# Patient Record
Sex: Female | Born: 1945 | Hispanic: No | Smoking: Never smoker
Health system: Southern US, Community
[De-identification: ages and names within clinical notes are randomized; demographics above are authoritative.]

## PROBLEM LIST (undated history)

## (undated) DIAGNOSIS — E119 Type 2 diabetes mellitus without complications: Secondary | ICD-10-CM

## (undated) DIAGNOSIS — M1990 Unspecified osteoarthritis, unspecified site: Secondary | ICD-10-CM

## (undated) DIAGNOSIS — E785 Hyperlipidemia, unspecified: Secondary | ICD-10-CM

## (undated) DIAGNOSIS — E079 Disorder of thyroid, unspecified: Secondary | ICD-10-CM

## (undated) DIAGNOSIS — M810 Age-related osteoporosis without current pathological fracture: Secondary | ICD-10-CM

## (undated) HISTORY — PX: KNEE ARTHROSCOPY: SUR90

## (undated) HISTORY — DX: Disorder of thyroid, unspecified: E07.9

## (undated) HISTORY — PX: ROTATOR CUFF REPAIR: SHX139

## (undated) HISTORY — PX: FINGER AMPUTATION: SHX636

## (undated) HISTORY — DX: Type 2 diabetes mellitus without complications: E11.9

## (undated) HISTORY — PX: SPINE SURGERY: SHX786

## (undated) HISTORY — DX: Unspecified osteoarthritis, unspecified site: M19.90

## (undated) HISTORY — DX: Hyperlipidemia, unspecified: E78.5

## (undated) HISTORY — DX: Age-related osteoporosis without current pathological fracture: M81.0

---

## 2019-08-31 ENCOUNTER — Ambulatory Visit (INDEPENDENT_AMBULATORY_CARE_PROVIDER_SITE_OTHER): Payer: Medicare Other | Admitting: Orthopaedic Surgery

## 2019-08-31 ENCOUNTER — Ambulatory Visit (INDEPENDENT_AMBULATORY_CARE_PROVIDER_SITE_OTHER): Payer: Medicare Other

## 2019-08-31 ENCOUNTER — Encounter: Payer: Self-pay | Admitting: Orthopaedic Surgery

## 2019-08-31 VITALS — Ht <= 58 in | Wt 125.0 lb

## 2019-08-31 DIAGNOSIS — M545 Low back pain, unspecified: Secondary | ICD-10-CM

## 2019-08-31 DIAGNOSIS — G8929 Other chronic pain: Secondary | ICD-10-CM

## 2019-08-31 MED ORDER — METHYLPREDNISOLONE 4 MG PO TBPK: ORAL_TABLET | ORAL | 0 refills | Status: DC

## 2019-08-31 MED ORDER — METHOCARBAMOL 500 MG PO TABS: 500.0000 mg | ORAL_TABLET | Freq: Two times a day (BID) | ORAL | 0 refills | Status: DC | PRN

## 2019-08-31 NOTE — Progress Notes (Signed)
   Office Visit Note   Patient: Christine Black           Date of Birth: Nov 09, 1946           MRN: [id number] Visit Date: 08/31/2019              Requested by: No referring provider defined for this encounter. PCP: Patient, No Pcp Per   Assessment & Plan: Visit Diagnoses:  1. Chronic low back pain, unspecified back pain laterality, unspecified whether sciatica present     Plan: Impression is multilevel lumbar spine spondylosis.  We will start the patient on an anti-inflammatory muscle relaxer as well as send her to physical therapy.  She will follow-up with [place] as needed.  Follow-Up Instructions: Return if symptoms worsen or fail to improve.   Orders:  Orders Placed This Encounter  Procedures  . XR Lumbar Spine 2-3 Views   Meds ordered this encounter  Medications  . methylPREDNISolone (MEDROL DOSEPAK) 4 MG TBPK tablet    Sig: Take as directed    Dispense:  21 tablet    Refill:  0  . methocarbamol (ROBAXIN) 500 MG tablet    Sig: Take 1 tablet (500 mg total) by mouth 2 (two) times daily as needed for muscle spasms.    Dispense:  20 tablet    Refill:  0      Procedures: No procedures performed   Clinical Data: No additional findings.   Subjective: Chief Complaint  Patient presents with  . Lower Back - Pain    HPI patient is a pleasant 73 year old female who presents our clinic today with left lower back pain as well as pain to the lateral lower leg.  This has been ongoing for the past 2 years.  No known injury or change in activity.  Pain she has is worse when she is sitting or sleeping as well as when she goes from a seated to standing position.  She does have a history of diabetic peripheral neuropathy and has been on gabapentin for this.  She is also tried Aleve for her symptoms with minimal improvement.  No previous lumbar ESI or surgical intervention.  Review of Systems as detailed in HPI.  All others reviewed and are negative.   Objective: Vital Signs: Ht 4'  7" (1.397 m)   Wt 125 lb (56.7 kg)   BMI 29.05 kg/m   Physical Exam well-developed and well-nourished female in no acute distress.  Alert and oriented x3.  Ortho Exam examination of the lumbar spine reveals no spinous tenderness.  She has mild tenderness to the paraspinous musculature.  Negative logroll negative straight leg raise.  No tenderness to the greater trochanter.  Specialty Comments:  No specialty comments available.  Imaging: Xr Lumbar Spine 2-3 Views  Result Date: 08/31/2019 Marked disc and facet space narrowing.    PMFS History: There are no active problems to display for this patient.  History reviewed. No pertinent past medical history.  History reviewed. No pertinent family history.  History reviewed. No pertinent surgical history. Social History   Occupational History  . Not on file  Tobacco Use  . Smoking status: Never Smoker  . Smokeless tobacco: Never Used  Substance and Sexual Activity  . Alcohol use: Not Currently  . Drug use: Never  . Sexual activity: Not on file

## 2019-09-04 ENCOUNTER — Telehealth: Payer: Self-pay | Admitting: Orthopaedic Surgery

## 2019-09-04 DIAGNOSIS — M545 Low back pain, unspecified: Secondary | ICD-10-CM

## 2019-09-04 DIAGNOSIS — G8929 Other chronic pain: Secondary | ICD-10-CM

## 2019-09-04 NOTE — Telephone Encounter (Signed)
Ok for change.

## 2019-09-04 NOTE — Telephone Encounter (Signed)
Message sent in error

## 2019-09-04 NOTE — Telephone Encounter (Signed)
ok 

## 2019-09-04 NOTE — Telephone Encounter (Signed)
Patient does not want to do PT at [facility]. She would like to be referred to [facility] for her PT.

## 2019-09-05 NOTE — Telephone Encounter (Signed)
Done, they are listed as [facility] (AKA [facility], [place] address)

## 2019-09-05 NOTE — Addendum Note (Signed)
Addended by: Meyer Cory on: 09/05/2019 09:34 AM   Modules accepted: Orders

## 2019-09-05 NOTE — Telephone Encounter (Signed)
Can you please get this referral sent to [facility] for PT for me?  I could not find them to choose them in the system.

## 2021-04-09 ENCOUNTER — Other Ambulatory Visit: Payer: Self-pay | Admitting: Physician Assistant

## 2021-04-09 DIAGNOSIS — Z1231 Encounter for screening mammogram for malignant neoplasm of breast: Secondary | ICD-10-CM

## 2021-04-15 DIAGNOSIS — M810 Age-related osteoporosis without current pathological fracture: Secondary | ICD-10-CM | POA: Diagnosis not present

## 2021-04-15 DIAGNOSIS — K219 Gastro-esophageal reflux disease without esophagitis: Secondary | ICD-10-CM | POA: Diagnosis not present

## 2021-04-15 DIAGNOSIS — K591 Functional diarrhea: Secondary | ICD-10-CM | POA: Diagnosis not present

## 2021-04-15 DIAGNOSIS — E1165 Type 2 diabetes mellitus with hyperglycemia: Secondary | ICD-10-CM | POA: Diagnosis not present

## 2021-04-15 DIAGNOSIS — J302 Other seasonal allergic rhinitis: Secondary | ICD-10-CM | POA: Diagnosis not present

## 2021-04-15 DIAGNOSIS — N289 Disorder of kidney and ureter, unspecified: Secondary | ICD-10-CM | POA: Diagnosis not present

## 2021-04-15 DIAGNOSIS — E782 Mixed hyperlipidemia: Secondary | ICD-10-CM | POA: Diagnosis not present

## 2021-04-15 DIAGNOSIS — E039 Hypothyroidism, unspecified: Secondary | ICD-10-CM | POA: Diagnosis not present

## 2021-05-05 DIAGNOSIS — R197 Diarrhea, unspecified: Secondary | ICD-10-CM | POA: Diagnosis not present

## 2021-05-13 DIAGNOSIS — J302 Other seasonal allergic rhinitis: Secondary | ICD-10-CM | POA: Diagnosis not present

## 2021-05-13 DIAGNOSIS — K219 Gastro-esophageal reflux disease without esophagitis: Secondary | ICD-10-CM | POA: Diagnosis not present

## 2021-05-13 DIAGNOSIS — K591 Functional diarrhea: Secondary | ICD-10-CM | POA: Diagnosis not present

## 2021-05-13 DIAGNOSIS — E782 Mixed hyperlipidemia: Secondary | ICD-10-CM | POA: Diagnosis not present

## 2021-05-13 DIAGNOSIS — E1165 Type 2 diabetes mellitus with hyperglycemia: Secondary | ICD-10-CM | POA: Diagnosis not present

## 2021-05-13 DIAGNOSIS — M810 Age-related osteoporosis without current pathological fracture: Secondary | ICD-10-CM | POA: Diagnosis not present

## 2021-05-13 DIAGNOSIS — E039 Hypothyroidism, unspecified: Secondary | ICD-10-CM | POA: Diagnosis not present

## 2021-05-13 DIAGNOSIS — N289 Disorder of kidney and ureter, unspecified: Secondary | ICD-10-CM | POA: Diagnosis not present

## 2021-06-02 ENCOUNTER — Ambulatory Visit
Admission: RE | Admit: 2021-06-02 | Discharge: 2021-06-02 | Disposition: A | Discharge status: Home / Self Care | Payer: Medicare Other | Source: Ambulatory Visit | Attending: Physician Assistant | Admitting: Physician Assistant

## 2021-06-02 ENCOUNTER — Encounter: Payer: Self-pay | Admitting: Cardiology

## 2021-06-02 ENCOUNTER — Ambulatory Visit: Payer: Medicare Other | Admitting: Cardiology

## 2021-06-02 ENCOUNTER — Other Ambulatory Visit: Payer: Self-pay

## 2021-06-02 VITALS — BP 124/82 | HR 91 | Temp 98.2°F | Resp 16 | Ht <= 58 in | Wt 118.8 lb

## 2021-06-02 DIAGNOSIS — Z1231 Encounter for screening mammogram for malignant neoplasm of breast: Secondary | ICD-10-CM

## 2021-06-02 DIAGNOSIS — E1165 Type 2 diabetes mellitus with hyperglycemia: Secondary | ICD-10-CM | POA: Diagnosis not present

## 2021-06-02 DIAGNOSIS — E782 Mixed hyperlipidemia: Secondary | ICD-10-CM

## 2021-06-02 MED ORDER — ASPIRIN 81 MG PO CHEW: 81.0000 mg | CHEWABLE_TABLET | Freq: Every day | ORAL | Status: DC

## 2021-06-02 NOTE — Progress Notes (Signed)
Primary Physician/Referring:  Benito Mccreedy, MD  Patient ID: Christine Black, female    DOB: 1946-03-11, 75 y.o.   MRN: [id number]  Chief Complaint  Patient presents with   Hyperlipidemia    familial   New Patient (Initial Visit)    Ref by Benito Mccreedy, MD   HPI:    Christine Black  is a 75 y.o. 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  Patient has diabetes mellitus, but no other cardiovascular risk factors, no hypertension or family history of premature coronary disease or tobacco use disorder.  She denies any chest pain or shortness of breath and states that she walks at least 1 hour to 1-1/2 hours on a daily basis without any limitations.  Past Medical History:  Diagnosis Date   Diabetes mellitus without complication ([place])    Hyperlipidemia    Thyroid disease    Past Surgical History:  Procedure Laterality Date   ROTATOR CUFF REPAIR Bilateral    Family History  Problem Relation Age of Onset   Heart attack Father    Breast cancer Neg Hx     Social History   Tobacco Use   Smoking status: Never   Smokeless tobacco: Never  Substance Use Topics   Alcohol use: Not Currently   Marital Status: Widowed  ROS  Review of Systems  Cardiovascular:  Negative for chest pain, dyspnea on exertion and leg swelling.  Gastrointestinal:  Negative for melena.  Objective  Blood pressure 124/82, pulse 91, temperature 98.2 F (36.8 C), temperature source Temporal, resp. rate 16, height _0  (1.397 m), weight 118 lb 12.8 oz (53.9 kg), SpO2 98 %. Body mass index is 27.61 kg/m.   Vitals with BMI 06/02/2021 08/31/2019  Height _1  _2   Weight 118 lbs 13 oz 125 lbs  BMI 76.16 07.37  Systolic 106 -  Diastolic 82 -  Pulse 91 -     Physical Exam Constitutional:      Comments: Short stature and overweight.  Neck:     Vascular: No carotid bruit or JVD.  Cardiovascular:     Rate and Rhythm: Normal rate and regular rhythm.      Pulses: Intact distal pulses.     Heart sounds: Normal heart sounds. No murmur heard.   No gallop.  Pulmonary:     Effort: Pulmonary effort is normal.     Breath sounds: Normal breath sounds.  Abdominal:     General: Bowel sounds are normal.     Palpations: Abdomen is soft.  Musculoskeletal:        General: No swelling.  Skin:    Capillary Refill: Capillary refill takes less than 2 seconds.  Neurological:     Mental Status: She is alert.  Psychiatric:        Behavior: Behavior is cooperative.     Laboratory examination:   Lipid Panel No results for input(s): CHOL, TRIG, LDLCALC, VLDL, HDL, CHOLHDL, LDLDIRECT in the last 8760 hours. Lipid Panel  No results found for: CHOL, TRIG, HDL, CHOLHDL, VLDL, LDLCALC, LDLDIRECT, LABVLDL   External labs:   Labs 04/08/2021:  Serum glucose 136, BUN 9, creatinine 0.89, EGFR 65/75 mill, CMP otherwise normal, potassium 4.0.  Total cholesterol 355, triglycerides 1124, HDL 48, LDL not calculated.  Non-HDL cholesterol 307.  A1c 7.3%.  TSH normal, T3 and free T4 normal.  Labs 12/19/2020:  Total cholesterol 278, triglycerides 130, HDL 52, non-HDL cholesterol 226.  Medications and allergies  No Known Allergies  Medication prior to this encounter:   Outpatient Medications Prior to Visit  Medication Sig Dispense Refill   esomeprazole (NEXIUM) 40 MG capsule Take 40 mg by mouth daily at 12 noon.     fenofibrate (TRICOR) 145 MG tablet Take 145 mg by mouth daily.     glipiZIDE (GLUCOTROL XL) 10 MG 24 hr tablet Take 10 mg by mouth daily.     ibandronate (BONIVA) 150 MG tablet Take 150 mg by mouth every 30 (thirty) days. Take in the morning with a full glass of water, on an empty stomach, and do not take anything else by mouth or lie down for the next 30 min.     levothyroxine (SYNTHROID) 75 MCG tablet TK 1 T PO QAM ON AN EMPTY STOMACH     meloxicam (MOBIC) 15 MG tablet Take 15 mg by mouth daily.     pioglitazone (ACTOS) 15 MG tablet TK 1 T PO  D     traMADol (ULTRAM) 50 MG tablet Take by mouth every 6 (six) hours as needed.     gabapentin (NEURONTIN) 300 MG capsule TK 1 C PO TID     metFORMIN (GLUCOPHAGE-XR) 500 MG 24 hr tablet TK 1 T PO BID     methocarbamol (ROBAXIN) 500 MG tablet Take 1 tablet (500 mg total) by mouth 2 (two) times daily as needed for muscle spasms. 20 tablet 0   methylPREDNISolone (MEDROL DOSEPAK) 4 MG TBPK tablet Take as directed 21 tablet 0   No facility-administered medications prior to visit.     FINAL MEDICATION AS OF TODAY:   Medications after current encounter Current Outpatient Medications  Medication Instructions   aspirin (ASPIRIN CHILDRENS) 81 mg, Oral, Daily   esomeprazole (NEXIUM) 40 mg, Oral, Daily   fenofibrate (TRICOR) 145 mg, Oral, Daily   glipiZIDE (GLUCOTROL XL) 10 mg, Oral, Daily   ibandronate (BONIVA) 150 mg, Oral, Every 30 days, Take in the morning with a full glass of water, on an empty stomach, and do not take anything else by mouth or lie down for the next 30 min.    levothyroxine (SYNTHROID) 75 MCG tablet TK 1 T PO QAM ON AN EMPTY STOMACH   meloxicam (MOBIC) 15 mg, Oral, Daily   pioglitazone (ACTOS) 15 MG tablet TK 1 T PO D   traMADol (ULTRAM) 50 MG tablet Oral, Every 6 hours PRN    Radiology:   No results found.  Cardiac Studies:   None EKG:   EKG 06/02/2021: Normal sinus rhythm at rate of 82 bpm, normal axis, incomplete right bundle branch block.  No evidence of ischemia.    Assessment     ICD-10-CM   1. Mixed hyperlipidemia  E78.2 EKG 12-Lead    LDL cholesterol, direct    Lipid Panel With LDL/HDL Ratio    Ambulatory referral to Nutrition and Diabetic Education    2. Type 2 diabetes mellitus with hyperglycemia, without long-term current use of insulin (HCC)  E11.65 aspirin (ASPIRIN CHILDRENS) 81 MG chewable tablet    Ambulatory referral to Nutrition and Diabetic Education       CV risk > 20%  Medications Discontinued During This Encounter  Medication Reason    gabapentin (NEURONTIN) 300 MG capsule Error   metFORMIN (GLUCOPHAGE-XR) 500 MG 24 hr tablet Error   methocarbamol (ROBAXIN) 500 MG tablet Error   methylPREDNISolone (MEDROL DOSEPAK) 4 MG TBPK tablet Error    Meds ordered this encounter  Medications   aspirin (ASPIRIN CHILDRENS) 81 MG chewable tablet  Sig: Chew 1 tablet (81 mg total) by mouth daily.   Orders Placed This Encounter  Procedures   LDL cholesterol, direct   Lipid Panel With LDL/HDL Ratio   Ambulatory referral to Nutrition and Diabetic Education    Referral Priority:   Routine    Referral Type:   Consultation    Referral Reason:   Specialty Services Required    Number of Visits Requested:   1   EKG 12-Lead   Recommendations:   Christine Black is a 75 y.o. 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  Patient has diabetes mellitus, but no other cardiovascular risk factors, no hypertension or family history of premature coronary disease or tobacco use disorder.  She has no clinical evidence of xanthelasma or tendon xanthoma or strong family history of premature coronary disease, recurrent pancreatitis or arthritis.  Hence familial origin to her elevated triglycerides is probably unlikely.  On further discussion she has very poor eating habits and especially has high carbohydrate intake.  I have recommended repeating fasting lipids again and obtaining direct LDL before starting her on therapy.  In view of diabetic state, I will prefer to be on a statin as well.  Labs were ordered today and I will make further recommendation depending upon the lab findings.  Referral made for diabetes education and diet education.  I have recommended that she start aspirin 81 mg daily.  She walks for about an hour every day without any chest pain or dyspnea, hence not sure whether she needs any cardiac work-up although her CV risk is >20% in view of her diabetes and her age as risk factors.  She  is presently 75 years of age and looks well with normal physical examination except for mild obesity.  Weight loss has been discussed extensively.  I will make further recommendations after the lipid profile testing and have a low threshold to start her on a statin and also start her on Vascepa.    Adrian Prows, MD, [facility] 06/02/2021, 9:54 PM Office: [phone]

## 2021-06-05 DIAGNOSIS — E782 Mixed hyperlipidemia: Secondary | ICD-10-CM | POA: Diagnosis not present

## 2021-06-06 LAB — LIPID PANEL WITH LDL/HDL RATIO
Cholesterol, Total: 261 mg/dL — ABNORMAL HIGH (ref 100–199)
HDL: 63 mg/dL (ref >39)
LDL Chol Calc (NIH): 164 mg/dL — ABNORMAL HIGH (ref 0–99)
LDL/HDL Ratio: 2.6 ratio (ref 0.0–3.2)
Triglycerides: 188 mg/dL — ABNORMAL HIGH (ref 0–149)
VLDL Cholesterol Cal: 34 mg/dL (ref 5–40)

## 2021-06-06 LAB — LDL CHOLESTEROL, DIRECT: LDL Direct: 161 mg/dL — ABNORMAL HIGH (ref 0–99)

## 2021-06-12 NOTE — Progress Notes (Signed)
Needs OV to discuss her lipids not urgent maybe in 4-6 weeks

## 2021-07-08 DIAGNOSIS — K219 Gastro-esophageal reflux disease without esophagitis: Secondary | ICD-10-CM | POA: Diagnosis not present

## 2021-07-08 DIAGNOSIS — E1165 Type 2 diabetes mellitus with hyperglycemia: Secondary | ICD-10-CM | POA: Diagnosis not present

## 2021-07-08 DIAGNOSIS — E039 Hypothyroidism, unspecified: Secondary | ICD-10-CM | POA: Diagnosis not present

## 2021-07-08 DIAGNOSIS — J302 Other seasonal allergic rhinitis: Secondary | ICD-10-CM | POA: Diagnosis not present

## 2021-07-08 DIAGNOSIS — E782 Mixed hyperlipidemia: Secondary | ICD-10-CM | POA: Diagnosis not present

## 2021-07-08 DIAGNOSIS — K591 Functional diarrhea: Secondary | ICD-10-CM | POA: Diagnosis not present

## 2021-07-08 DIAGNOSIS — M25561 Pain in right knee: Secondary | ICD-10-CM | POA: Diagnosis not present

## 2021-07-08 DIAGNOSIS — N289 Disorder of kidney and ureter, unspecified: Secondary | ICD-10-CM | POA: Diagnosis not present

## 2021-07-08 DIAGNOSIS — M810 Age-related osteoporosis without current pathological fracture: Secondary | ICD-10-CM | POA: Diagnosis not present

## 2021-07-08 DIAGNOSIS — I10 Essential (primary) hypertension: Secondary | ICD-10-CM | POA: Diagnosis not present

## 2021-07-11 ENCOUNTER — Other Ambulatory Visit: Payer: Self-pay

## 2021-07-11 ENCOUNTER — Ambulatory Visit: Payer: Medicare Other | Admitting: Cardiology

## 2021-07-11 ENCOUNTER — Encounter: Payer: Self-pay | Admitting: Cardiology

## 2021-07-11 VITALS — BP 134/76 | HR 75 | Temp 98.2°F | Resp 17 | Ht <= 58 in | Wt 121.4 lb

## 2021-07-11 DIAGNOSIS — E781 Pure hyperglyceridemia: Secondary | ICD-10-CM

## 2021-07-11 DIAGNOSIS — E782 Mixed hyperlipidemia: Secondary | ICD-10-CM

## 2021-07-11 MED ORDER — ATORVASTATIN CALCIUM 40 MG PO TABS
40.0000 mg | ORAL_TABLET | Freq: Every day | ORAL | 3 refills | Status: DC
Start: 2021-07-11 — End: 2021-09-05

## 2021-07-11 MED ORDER — ICOSAPENT ETHYL 1 G PO CAPS: 2.0000 g | ORAL_CAPSULE | Freq: Two times a day (BID) | ORAL | 3 refills | Status: DC

## 2021-07-11 NOTE — Progress Notes (Signed)
Primary Physician/Referring:  Benito Mccreedy, MD  Patient ID: Christine Black, female    DOB: [birth year], 75 y.o.   MRN: [id number]  Chief Complaint  Patient presents with   mixed hyperlipidemia    4-6 weeks   HPI:    Christine Black  is a 75 y.o. 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  Patient has diabetes mellitus, but no other cardiovascular risk factors, no hypertension or family history of premature coronary disease or tobacco use disorder.   Patient presents for follow up of hyperlipidemia and hypertriglyceridemia.  Recent lipid profile testing revealed LDL 161 and triglycerides 190.  Notably patient was recently started on amlodipine 2.5 mg daily by PCP for elevated blood pressure.  She is scheduled to see nutritionist next month as she continues to struggle to make diet modifications for cholesterol management and diabetes control.  Patient continues to walk approximately 1 hour on a daily basis without issue.  Denies chest pain, dyspnea, palpitations, syncope, near syncope.  Past Medical History:  Diagnosis Date   Diabetes mellitus without complication ([place])    Hyperlipidemia    Thyroid disease    Past Surgical History:  Procedure Laterality Date   ROTATOR CUFF REPAIR Bilateral    Family History  Problem Relation Age of Onset   Arthritis Mother    Heart attack Father 35   Arthritis Brother    Heart disease Brother    Lung disease Brother    Breast cancer Neg Hx     Social History   Tobacco Use   Smoking status: Never   Smokeless tobacco: Never  Substance Use Topics   Alcohol use: Not Currently   Marital Status: Widowed  ROS  Review of Systems  Constitutional: Negative for malaise/fatigue and weight gain.  Cardiovascular:  Negative for chest pain, claudication, dyspnea on exertion, leg swelling, near-syncope, orthopnea, palpitations, paroxysmal nocturnal dyspnea and syncope.  Respiratory:  Negative for  shortness of breath.   Gastrointestinal:  Negative for melena.  Neurological:  Negative for dizziness.  Objective  Blood pressure 134/76, pulse 75, temperature 98.2 F (36.8 C), temperature source Temporal, resp. rate 17, height '4\' 7"'  (1.397 m), weight 121 lb 6.4 oz (55.1 kg), SpO2 97 %. Body mass index is 28.22 kg/m.   Vitals with BMI 07/11/2021 06/02/2021 08/31/2019  Height '4\' 7"'  '4\' 7"'  '4\' 7"'   Weight 121 lbs 6 oz 118 lbs 13 oz 125 lbs  BMI 28.22 37.48 27.07  Systolic 867 544 -  Diastolic 76 82 -  Pulse 75 91 -     Physical Exam Vitals reviewed.  Constitutional:      Comments: Short stature and overweight.  Neck:     Vascular: No carotid bruit or JVD.  Cardiovascular:     Rate and Rhythm: Normal rate and regular rhythm.     Pulses: Intact distal pulses.     Heart sounds: Normal heart sounds. No murmur heard.   No gallop.  Pulmonary:     Effort: Pulmonary effort is normal.     Breath sounds: Normal breath sounds.  Musculoskeletal:        General: No swelling.  Skin:    Capillary Refill: Capillary refill takes less than 2 seconds.  Neurological:     Mental Status: She is alert.  Psychiatric:        Behavior: Behavior is cooperative.     Laboratory examination:   Lipid Panel Recent Labs    06/05/21 0810  CHOL 261*  TRIG 188*  LDLCALC 164*  HDL 63  LDLDIRECT 161*   Lipid Panel     Component Value Date/Time   CHOL 261 (H) 06/05/2021 0810   TRIG 188 (H) 06/05/2021 0810   HDL 63 06/05/2021 0810   LDLCALC 164 (H) 06/05/2021 0810   LDLDIRECT 161 (H) 06/05/2021 0810   LABVLDL 34 06/05/2021 0810     External labs:   04/08/2021: Serum glucose 136, BUN 9, creatinine 0.89, EGFR 65/75 mill, CMP otherwise normal, potassium 4.0. Total cholesterol 355, triglycerides 1124, HDL 48, LDL not calculated.  Non-HDL cholesterol 307. A1c 7.3%.  TSH normal, T3 and free T4 normal.  12/19/2020: Total cholesterol 278, triglycerides 130, HDL 52, non-HDL cholesterol  226.  Medications and allergies  No Known Allergies    Medication prior to this encounter:   Outpatient Medications Prior to Visit  Medication Sig Dispense Refill   aspirin (ASPIRIN CHILDRENS) 81 MG chewable tablet Chew 1 tablet (81 mg total) by mouth daily.     esomeprazole (NEXIUM) 40 MG capsule Take 40 mg by mouth daily at 12 noon.     fenofibrate (TRICOR) 145 MG tablet Take 145 mg by mouth daily.     glipiZIDE (GLUCOTROL XL) 10 MG 24 hr tablet Take 10 mg by mouth daily.     ibandronate (BONIVA) 150 MG tablet Take 150 mg by mouth every 30 (thirty) days. Take in the morning with a full glass of water, on an empty stomach, and do not take anything else by mouth or lie down for the next 30 min.     levothyroxine (SYNTHROID) 75 MCG tablet TK 1 T PO QAM ON AN EMPTY STOMACH     meloxicam (MOBIC) 15 MG tablet Take 15 mg by mouth daily.     pioglitazone (ACTOS) 15 MG tablet TK 1 T PO D     traMADol (ULTRAM) 50 MG tablet Take by mouth every 6 (six) hours as needed.     amLODipine (NORVASC) 2.5 MG tablet Take 1 tablet by mouth daily. (Patient not taking: Reported on 07/11/2021)     No facility-administered medications prior to visit.     FINAL MEDICATION AS OF TODAY:   Medications after current encounter Current Outpatient Medications  Medication Instructions   amLODipine (NORVASC) 2.5 MG tablet 1 tablet, Daily   aspirin (ASPIRIN CHILDRENS) 81 mg, Oral, Daily   atorvastatin (LIPITOR) 40 mg, Oral, Daily   esomeprazole (NEXIUM) 40 mg, Oral, Daily   fenofibrate (TRICOR) 145 mg, Oral, Daily   glipiZIDE (GLUCOTROL XL) 10 mg, Oral, Daily   ibandronate (BONIVA) 150 mg, Oral, Every 30 days, Take in the morning with a full glass of water, on an empty stomach, and do not take anything else by mouth or lie down for the next 30 min.    icosapent Ethyl (VASCEPA) 2 g, Oral, 2 times daily with meals   levothyroxine (SYNTHROID) 75 MCG tablet TK 1 T PO QAM ON AN EMPTY STOMACH   meloxicam (MOBIC) 15 mg,  Oral, Daily   pioglitazone (ACTOS) 15 MG tablet TK 1 T PO D   traMADol (ULTRAM) 50 MG tablet Oral, Every 6 hours PRN    Radiology:   No results found.  Cardiac Studies:   None  EKG:   EKG 06/02/2021: Normal sinus rhythm at rate of 82 bpm, normal axis, incomplete right bundle branch block.  No evidence of ischemia.    Assessment     ICD-10-CM   1. Mixed hyperlipidemia  E78.2 Lipid Panel With  LDL/HDL Ratio    Direct LDL    2. Hypertriglyceridemia  E78.1        CV risk > 20%  There are no discontinued medications.   Meds ordered this encounter  Medications   atorvastatin (LIPITOR) 40 MG tablet    Sig: Take 1 tablet (40 mg total) by mouth daily.    Dispense:  90 tablet    Refill:  3   icosapent Ethyl (VASCEPA) 1 g capsule    Sig: Take 2 capsules (2 g total) by mouth 2 (two) times daily with a meal.    Dispense:  360 capsule    Refill:  3   Orders Placed This Encounter  Procedures   Lipid Panel With LDL/HDL Ratio    Standing Status:   Future    Standing Expiration Date:   07/11/2022   Direct LDL    Standing Status:   Future    Standing Expiration Date:   07/11/2022   Recommendations:   Christine Black is a 75 y.o. 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  Patient has diabetes mellitus, but no other cardiovascular risk factors, no hypertension or family history of premature coronary disease or tobacco use disorder.  She has no clinical evidence of xanthelasma or tendon xanthoma or strong family history of premature coronary disease, recurrent pancreatitis or arthritis.  Hence familial origin to her elevated triglycerides is probably unlikely.    Patient presents for follow-up of hyperlipidemia and hypertriglyceridemia.  Patient's LDL and triglycerides remain elevated above goal.  We will start statin 40 mg nightly and Vascepa 2 g twice daily with meals.  We will repeat lipid profile testing including direct LDL in 8  weeks.  Patient continues to struggle with diet modifications, again discussed at length regarding the importance of heart healthy and low-carb diet both for cardiovascular risk reduction as well as diabetes control and weight loss.  Patient remains asymptomatic and very active on a daily basis.  However CV risk is >20% in view of diabetes, age, and risk factors. I have a low threshold to pursue further ischemic evaluation, however discussed with patient and her daughter who is present at bedside regarding benefits of further ischemic evaluation and shared decision was to hold off at this time.  Follow-up in 8 weeks, sooner if needed, for hyperlipidemia and hypertriglyceridemia.   Christine Berthold, PA-C 07/11/2021, 1:57 PM Office: [phone]

## 2021-07-11 NOTE — Patient Instructions (Signed)
Diabetes Mellitus and Nutrition, Adult When you have diabetes, or diabetes mellitus, it is very important to have healthy eating habits because your blood sugar (glucose) levels are greatly affected by what you eat and drink. Eating healthy foods in the right amounts, at about the same times every day, can help you:  Control your blood glucose.  Lower your risk of heart disease.  Improve your blood pressure.  Reach or maintain a healthy weight. What can affect my meal plan? Every person with diabetes is different, and each person has different needs for a meal plan. Your health care provider may recommend that you work with a dietitian to make a meal plan that is best for you. Your meal plan may vary depending on factors such as:  The calories you need.  The medicines you take.  Your weight.  Your blood glucose, blood pressure, and cholesterol levels.  Your activity level.  Other health conditions you have, such as heart or kidney disease. How do carbohydrates affect me? Carbohydrates, also called carbs, affect your blood glucose level more than any other type of food. Eating carbs naturally raises the amount of glucose in your blood. Carb counting is a method for keeping track of how many carbs you eat. Counting carbs is important to keep your blood glucose at a healthy level, especially if you use insulin or take certain oral diabetes medicines. It is important to know how many carbs you can safely have in each meal. This is different for every person. Your dietitian can help you calculate how many carbs you should have at each meal and for each snack. How does alcohol affect me? Alcohol can cause a sudden decrease in blood glucose (hypoglycemia), especially if you use insulin or take certain oral diabetes medicines. Hypoglycemia can be a life-threatening condition. Symptoms of hypoglycemia, such as sleepiness, dizziness, and confusion, are similar to symptoms of having too much  alcohol.  Do not drink alcohol if: ? Your health care provider tells you not to drink. ? You are pregnant, may be pregnant, or are planning to become pregnant.  If you drink alcohol: ? Do not drink on an empty stomach. ? Limit how much you use to:  0-1 drink a day for women.  0-2 drinks a day for men. ? Be aware of how much alcohol is in your drink. In the [place] one drink equals one 12 oz bottle of beer (355 mL), one 5 oz glass of wine (148 mL), or one 1 oz glass of hard liquor (44 mL). ? Keep yourself hydrated with water, diet soda, or unsweetened iced tea.  Keep in mind that regular soda, juice, and other mixers may contain a lot of sugar and must be counted as carbs. What are tips for following this plan? Reading food labels  Start by checking the serving size on the "Nutrition Facts" label of packaged foods and drinks. The amount of calories, carbs, fats, and other nutrients listed on the label is based on one serving of the item. Many items contain more than one serving per package.  Check the total grams (g) of carbs in one serving. You can calculate the number of servings of carbs in one serving by dividing the total carbs by 15. For example, if a food has 30 g of total carbs per serving, it would be equal to 2 servings of carbs.  Check the number of grams (g) of saturated fats and trans fats in one serving. Choose foods that have   a low amount or none of these fats.  Check the number of milligrams (mg) of salt (sodium) in one serving. Most people should limit total sodium intake to less than 2,300 mg per day.  Always check the nutrition information of foods labeled as "low-fat" or "nonfat." These foods may be higher in added sugar or refined carbs and should be avoided.  Talk to your dietitian to identify your daily goals for nutrients listed on the label. Shopping  Avoid buying canned, pre-made, or processed foods. These foods tend to be high in fat, sodium, and added  sugar.  Shop around the outside edge of the grocery store. This is where you will most often find fresh fruits and vegetables, bulk grains, fresh meats, and fresh dairy. Cooking  Use low-heat cooking methods, such as baking, instead of high-heat cooking methods like deep frying.  Cook using healthy oils, such as olive, canola, or sunflower oil.  Avoid cooking with butter, cream, or high-fat meats. Meal planning  Eat meals and snacks regularly, preferably at the same times every day. Avoid going long periods of time without eating.  Eat foods that are high in fiber, such as fresh fruits, vegetables, beans, and whole grains. Talk with your dietitian about how many servings of carbs you can eat at each meal.  Eat 4-6 oz (112-168 g) of lean protein each day, such as lean meat, chicken, fish, eggs, or tofu. One ounce (oz) of lean protein is equal to: ? 1 oz (28 g) of meat, chicken, or fish. ? 1 egg. ?  cup (62 g) of tofu.  Eat some foods each day that contain healthy fats, such as avocado, nuts, seeds, and fish.   What foods should I eat? Fruits Berries. Apples. Oranges. Peaches. Apricots. Plums. Grapes. Mango. Papaya. Pomegranate. Kiwi. Cherries. Vegetables Lettuce. Spinach. Leafy greens, including kale, chard, collard greens, and mustard greens. Beets. Cauliflower. Cabbage. Broccoli. Carrots. Green beans. Tomatoes. Peppers. Onions. Cucumbers. Brussels sprouts. Grains Whole grains, such as whole-wheat or whole-grain bread, crackers, tortillas, cereal, and pasta. Unsweetened oatmeal. Quinoa. Brown or wild rice. Meats and other proteins Seafood. Poultry without skin. Lean cuts of poultry and beef. Tofu. Nuts. Seeds. Dairy Low-fat or fat-free dairy products such as milk, yogurt, and cheese. The items listed above may not be a complete list of foods and beverages you can eat. Contact a dietitian for more information. What foods should I avoid? Fruits Fruits canned with  syrup. Vegetables Canned vegetables. Frozen vegetables with butter or cream sauce. Grains Refined white flour and flour products such as bread, pasta, snack foods, and cereals. Avoid all processed foods. Meats and other proteins Fatty cuts of meat. Poultry with skin. Breaded or fried meats. Processed meat. Avoid saturated fats. Dairy Full-fat yogurt, cheese, or milk. Beverages Sweetened drinks, such as soda or iced tea. The items listed above may not be a complete list of foods and beverages you should avoid. Contact a dietitian for more information. Questions to ask a health care provider  Do I need to meet with a diabetes educator?  Do I need to meet with a dietitian?  What number can I call if I have questions?  When are the best times to check my blood glucose? Where to find more information:  American Diabetes Association: diabetes.org  Academy of Nutrition and Dietetics: www.eatright.org  National Institute of Diabetes and Digestive and Kidney Diseases: www.niddk.nih.gov  Association of Diabetes Care and Education Specialists: www.diabeteseducator.org Summary  It is important to have healthy eating   habits because your blood sugar (glucose) levels are greatly affected by what you eat and drink.  A healthy meal plan will help you control your blood glucose and maintain a healthy lifestyle.  Your health care provider may recommend that you work with a dietitian to make a meal plan that is best for you.  Keep in mind that carbohydrates (carbs) and alcohol have immediate effects on your blood glucose levels. It is important to count carbs and to use alcohol carefully. This information is not intended to replace advice given to you by your health care provider. Make sure you discuss any questions you have with your health care provider. Document Revised: 11/07/2019 Document Reviewed: 11/07/2019 Elsevier Patient Education  2021 [employer] Inc.  

## 2021-07-21 DIAGNOSIS — E782 Mixed hyperlipidemia: Secondary | ICD-10-CM | POA: Diagnosis not present

## 2021-07-21 DIAGNOSIS — I10 Essential (primary) hypertension: Secondary | ICD-10-CM | POA: Diagnosis not present

## 2021-07-21 DIAGNOSIS — E039 Hypothyroidism, unspecified: Secondary | ICD-10-CM | POA: Diagnosis not present

## 2021-07-21 DIAGNOSIS — M810 Age-related osteoporosis without current pathological fracture: Secondary | ICD-10-CM | POA: Diagnosis not present

## 2021-07-21 DIAGNOSIS — J028 Acute pharyngitis due to other specified organisms: Secondary | ICD-10-CM | POA: Diagnosis not present

## 2021-07-21 DIAGNOSIS — E1165 Type 2 diabetes mellitus with hyperglycemia: Secondary | ICD-10-CM | POA: Diagnosis not present

## 2021-07-21 DIAGNOSIS — N289 Disorder of kidney and ureter, unspecified: Secondary | ICD-10-CM | POA: Diagnosis not present

## 2021-07-21 DIAGNOSIS — M25561 Pain in right knee: Secondary | ICD-10-CM | POA: Diagnosis not present

## 2021-07-21 DIAGNOSIS — J302 Other seasonal allergic rhinitis: Secondary | ICD-10-CM | POA: Diagnosis not present

## 2021-07-21 DIAGNOSIS — K219 Gastro-esophageal reflux disease without esophagitis: Secondary | ICD-10-CM | POA: Diagnosis not present

## 2021-07-21 DIAGNOSIS — K591 Functional diarrhea: Secondary | ICD-10-CM | POA: Diagnosis not present

## 2021-08-01 ENCOUNTER — Ambulatory Visit: Payer: Medicare Other | Admitting: Dietician

## 2021-08-02 ENCOUNTER — Encounter (HOSPITAL_COMMUNITY): Payer: Self-pay

## 2021-08-02 ENCOUNTER — Ambulatory Visit (HOSPITAL_COMMUNITY)
Admission: EM | Admit: 2021-08-02 | Discharge: 2021-08-02 | Disposition: A | Discharge status: Home / Self Care | Payer: Medicare Other | Attending: Medical Oncology | Admitting: Medical Oncology

## 2021-08-02 ENCOUNTER — Other Ambulatory Visit: Payer: Self-pay

## 2021-08-02 DIAGNOSIS — U071 COVID-19: Secondary | ICD-10-CM | POA: Insufficient documentation

## 2021-08-02 DIAGNOSIS — J069 Acute upper respiratory infection, unspecified: Secondary | ICD-10-CM

## 2021-08-02 MED ORDER — BENZONATATE 100 MG PO CAPS: 100.0000 mg | ORAL_CAPSULE | Freq: Three times a day (TID) | ORAL | 0 refills | Status: DC

## 2021-08-02 NOTE — ED Triage Notes (Signed)
Pt presents with non productive cough and generalized body aches since yesterday with some lower abdominal pain.

## 2021-08-02 NOTE — ED Provider Notes (Addendum)
[facility]    CSN: [id number] Arrival date & time: 08/02/21  1500      History   Chief Complaint Chief Complaint  Patient presents with   Cough   Generalized Body Aches    HPI Lakrista Scaduto is a 75 y.o. female.  Patient defers medical translator and is able to answer all of my questions appropriately and relay back her instructions.  HPI  Cold Symptoms: Patient reports that since yesterday they have had a dry cough along with body aches and fatigue.  They deny any fever, significant sore throat, chest pain or shortness of breath.  They have had some abdominal discomfort as well without vomiting, nausea, dysuria, frequency or diarrhea.  They have tried aleve for symptoms with little relief.  No known sick contacts.  Past Medical History:  Diagnosis Date   Diabetes mellitus without complication (HCC)    Hyperlipidemia    Thyroid disease     There are no problems to display for this patient.   Past Surgical History:  Procedure Laterality Date   ROTATOR CUFF REPAIR Bilateral     OB History   No obstetric history on file.      Home Medications    Prior to Admission medications   Medication Sig Start Date End Date Taking? Authorizing Provider  amLODipine (NORVASC) 2.5 MG tablet Take 1 tablet by mouth daily. Patient not taking: Reported on 07/11/2021 07/08/21   [provider]  aspirin (ASPIRIN CHILDRENS) 81 MG chewable tablet Chew 1 tablet (81 mg total) by mouth daily. 06/02/21   Yates Decamp, MD  atorvastatin (LIPITOR) 40 MG tablet Take 1 tablet (40 mg total) by mouth daily. 07/11/21 07/06/22  Cantwell, Celeste C, PA-C  esomeprazole (NEXIUM) 40 MG capsule Take 40 mg by mouth daily at 12 noon.    [provider]  fenofibrate (TRICOR) 145 MG tablet Take 145 mg by mouth daily.    [provider]  glipiZIDE (GLUCOTROL XL) 10 MG 24 hr tablet Take 10 mg by mouth daily. 05/13/21   [provider]  ibandronate (BONIVA) 150 MG tablet  Take 150 mg by mouth every 30 (thirty) days. Take in the morning with a full glass of water, on an empty stomach, and do not take anything else by mouth or lie down for the next 30 min.    [provider]  icosapent Ethyl (VASCEPA) 1 g capsule Take 2 capsules (2 g total) by mouth 2 (two) times daily with a meal. 07/11/21   Cantwell, Celeste C, PA-C  levothyroxine (SYNTHROID) 75 MCG tablet TK 1 T PO QAM ON AN EMPTY STOMACH 08/01/19   [provider]  meloxicam (MOBIC) 15 MG tablet Take 15 mg by mouth daily.    [provider]  pioglitazone (ACTOS) 15 MG tablet TK 1 T PO D 06/20/19   [provider]  traMADol (ULTRAM) 50 MG tablet Take by mouth every 6 (six) hours as needed.    [provider]    Family History Family History  Problem Relation Age of Onset   Arthritis Mother    Heart attack Father 32   Arthritis Brother    Heart disease Brother    Lung disease Brother    Breast cancer Neg Hx     Social History Social History   Tobacco Use   Smoking status: Never   Smokeless tobacco: Never  Vaping Use   Vaping Use: Never used  Substance Use Topics   Alcohol use: Not  Currently   Drug use: Never     Allergies   Patient has no known allergies.   Review of Systems Review of Systems  As stated above in HPI Physical Exam Triage Vital Signs ED Triage Vitals  Enc Vitals Group     BP 08/02/21 1517 (!) 148/68     Pulse Rate 08/02/21 1517 73     Resp 08/02/21 1517 17     Temp 08/02/21 1517 98.7 F (37.1 C)     Temp Source 08/02/21 1517 Oral     SpO2 08/02/21 1517 100 %     Weight --      Height --      Head Circumference --      Peak Flow --      Pain Score 08/02/21 1519 7     Pain Loc --      Pain Edu? --      Excl. in GC? --    No data found.  Updated Vital Signs BP (!) 148/68 (BP Location: Right Arm)   Pulse 73   Temp 98.7 F (37.1 C) (Oral)   Resp 17   SpO2 100%   Physical Exam Vitals and nursing note reviewed.   Constitutional:      General: She is not in acute distress.    Appearance: Normal appearance. She is not ill-appearing, toxic-appearing or diaphoretic.  HENT:     Head: Normocephalic and atraumatic.     Right Ear: Tympanic membrane normal.     Left Ear: Tympanic membrane normal.     Nose: Nose normal.     Mouth/Throat:     Mouth: Mucous membranes are moist.     Pharynx: Oropharynx is clear. No oropharyngeal exudate or posterior oropharyngeal erythema.  Eyes:     Extraocular Movements: Extraocular movements intact.     Pupils: Pupils are equal, round, and reactive to light.  Cardiovascular:     Rate and Rhythm: Normal rate and regular rhythm.     Heart sounds: Normal heart sounds.  Pulmonary:     Effort: Pulmonary effort is normal.     Breath sounds: Normal breath sounds.     Comments: Dry slightly wheeze like cough Musculoskeletal:     Cervical back: Normal range of motion and neck supple.  Lymphadenopathy:     Cervical: No cervical adenopathy.  Skin:    General: Skin is warm.  Neurological:     Mental Status: She is alert and oriented to person, place, and time.     UC Treatments / Results  Labs (all labs ordered are listed, but only abnormal results are displayed) Labs Reviewed - No data to display  EKG   Radiology No results found.  Procedures Procedures (including critical care time)  Medications Ordered in UC Medications - No data to display  Initial Impression / Assessment and Plan / UC Course  I have reviewed the triage vital signs and the nursing notes.  Pertinent labs & imaging results that were available during my care of the patient were reviewed by me and considered in my medical decision making (see chart for details).     New.  Likely viral in nature which I discussed with patient.  With her oxygen saturation being 100% and she is afebrile.  Hold off on x-ray imaging at this time.  She is agreeable to COVID-19 testing.  Sending in [place] for  her to use as needed.  Discussed red flag signs and symptoms. Final Clinical Impressions(s) / UC Diagnoses  Final diagnoses:  None   Discharge Instructions   None    ED Prescriptions   None    PDMP not reviewed this encounter.   Rushie Chestnut, PA-C 08/02/21 1618    Rushie Chestnut, PA-C 08/02/21 [id number]

## 2021-08-03 LAB — SARS CORONAVIRUS 2 (TAT 6-24 HRS): SARS Coronavirus 2: POSITIVE — AB

## 2021-08-04 ENCOUNTER — Telehealth: Payer: Self-pay | Admitting: Pharmacist

## 2021-08-04 NOTE — Telephone Encounter (Signed)
Pt's Vascepa PA appeal approved. Called and discussed with pt and and pt's daughter. Co-pay of $30/37-month supply. Pt agreeable to pick up the medication and start taking it regularly moving forward.

## 2021-08-05 ENCOUNTER — Telehealth: Payer: Self-pay | Admitting: Pharmacist

## 2021-08-05 NOTE — Telephone Encounter (Signed)
Med list reviewed and updated with pt and pt's daughter. Pt noted to be taking both fenofibrate and atorvastatin. Pt noted to have some complains of muscle aches and pain. Pt recently diagnosed with COVID and is unsure if symptom is related to COVID or concerns of myopathy. Pt has an OV with PCP this Thursday and is planing to discuss with PCP. Reviewed with pt the need to get repeat labs in ~4 weeks to evaluate response from new start atorvastatin and vascepa.

## 2021-08-20 DIAGNOSIS — E1165 Type 2 diabetes mellitus with hyperglycemia: Secondary | ICD-10-CM | POA: Diagnosis not present

## 2021-08-20 DIAGNOSIS — M25561 Pain in right knee: Secondary | ICD-10-CM | POA: Diagnosis not present

## 2021-08-20 DIAGNOSIS — K591 Functional diarrhea: Secondary | ICD-10-CM | POA: Diagnosis not present

## 2021-08-20 DIAGNOSIS — J302 Other seasonal allergic rhinitis: Secondary | ICD-10-CM | POA: Diagnosis not present

## 2021-08-20 DIAGNOSIS — Z0000 Encounter for general adult medical examination without abnormal findings: Secondary | ICD-10-CM | POA: Diagnosis not present

## 2021-08-20 DIAGNOSIS — E039 Hypothyroidism, unspecified: Secondary | ICD-10-CM | POA: Diagnosis not present

## 2021-08-20 DIAGNOSIS — N289 Disorder of kidney and ureter, unspecified: Secondary | ICD-10-CM | POA: Diagnosis not present

## 2021-08-20 DIAGNOSIS — K219 Gastro-esophageal reflux disease without esophagitis: Secondary | ICD-10-CM | POA: Diagnosis not present

## 2021-08-20 DIAGNOSIS — I10 Essential (primary) hypertension: Secondary | ICD-10-CM | POA: Diagnosis not present

## 2021-08-20 DIAGNOSIS — E782 Mixed hyperlipidemia: Secondary | ICD-10-CM | POA: Diagnosis not present

## 2021-08-20 DIAGNOSIS — M810 Age-related osteoporosis without current pathological fracture: Secondary | ICD-10-CM | POA: Diagnosis not present

## 2021-09-03 NOTE — Progress Notes (Signed)
Primary Physician/Referring:  Benito Mccreedy, MD  Patient ID: Christine Black, female    DOB: [birth year], 75 y.o.   MRN: [id number]  Chief Complaint  Patient presents with   Hyperlipidemia   Follow-up   Results   muscle aches   HPI:    Christine Black  is a 75 y.o. 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  Patient has diabetes mellitus, but no other cardiovascular risk factors, no hypertension or family history of premature coronary disease or tobacco use disorder.   Patient presents for 2 month follow-up of hyperlipidemia and hypertriglyceridemia.  At last office visit started atorvastatin 40 mg daily as well as Vascepa 2 g twice daily.  However patient has not been taking Vascepa.  Repeat lipid profile testing revealed significant improvement, triglycerides reduced from 1124 to 105 and LDL now 75.  Made significant changes to her diet, reducing carbs and overall calorie intake.  Patient's primary concern today is complaints of myalgias since initiation of atorvastatin.   Patient continues to walk approximately 1 hour on a daily basis without issue.  Denies chest pain, dyspnea, palpitations, syncope, near syncope.  Notably patient has also inadvertently discontinued amlodipine.  Patient is accompanied by her daughter at today's office visit.   Past Medical History:  Diagnosis Date   Diabetes mellitus without complication ([place])    Hyperlipidemia    Thyroid disease    Past Surgical History:  Procedure Laterality Date   ROTATOR CUFF REPAIR Bilateral    Family History  Problem Relation Age of Onset   Arthritis Mother    Heart attack Father 64   Arthritis Brother    Heart disease Brother    Lung disease Brother    Breast cancer Neg Hx     Social History   Tobacco Use   Smoking status: Never   Smokeless tobacco: Never  Substance Use Topics   Alcohol use: Not Currently   Marital Status: Widowed  ROS  Review of Systems   Constitutional: Negative for malaise/fatigue and weight gain.  Cardiovascular:  Negative for chest pain, claudication, dyspnea on exertion, leg swelling, near-syncope, orthopnea, palpitations, paroxysmal nocturnal dyspnea and syncope.  Respiratory:  Negative for shortness of breath.   Musculoskeletal:  Positive for myalgias.  Gastrointestinal:  Negative for melena.  Neurological:  Negative for dizziness.  Objective  Blood pressure 132/71, pulse 76, temperature 98 F (36.7 C), temperature source Temporal, height '4\' 7"'  (1.397 m), weight 121 lb (54.9 kg), SpO2 99 %. Body mass index is 28.12 kg/m.   Vitals with BMI 09/05/2021 08/02/2021 07/11/2021  Height '4\' 7"'  - '4\' 7"'   Weight 121 lbs - 121 lbs 6 oz  BMI 95.09 - 32.67  Systolic 124 580 998  Diastolic 71 68 76  Pulse 76 73 75     Physical Exam Vitals reviewed.  Constitutional:      Comments: Short stature and overweight.  Neck:     Vascular: No carotid bruit or JVD.  Cardiovascular:     Rate and Rhythm: Normal rate and regular rhythm.     Pulses: Intact distal pulses.     Heart sounds: Normal heart sounds, S1 normal and S2 normal. No murmur heard.   No gallop.  Pulmonary:     Effort: Pulmonary effort is normal. No respiratory distress.     Breath sounds: Normal breath sounds. No wheezing, rhonchi or rales.  Musculoskeletal:     Right lower leg: No edema.     Left lower leg: No  edema.  Neurological:     Mental Status: She is alert.  Psychiatric:        Behavior: Behavior is cooperative.     Laboratory examination:   Lipid Panel Recent Labs    06/05/21 0810  CHOL 261*  TRIG 188*  LDLCALC 164*  HDL 63  LDLDIRECT 161*   Lipid Panel     Component Value Date/Time   CHOL 261 (H) 06/05/2021 0810   TRIG 188 (H) 06/05/2021 0810   HDL 63 06/05/2021 0810   LDLCALC 164 (H) 06/05/2021 0810   LDLDIRECT 161 (H) 06/05/2021 0810   LABVLDL 34 06/05/2021 0810     External labs:  08/20/2021: Glucose 116, BUN 21, creatinine 1.26,  GFR 45, sodium 143, potassium 4.7 Hgb 12.0, HCT 37.4, MCV 100, platelet 396 Triglycerides 105, total cholesterol 152, HDL 58, LDL 75 A1c 6.9% TSH 1.65  04/08/2021: Serum glucose 136, BUN 9, creatinine 0.89, EGFR 65/75 mill, CMP otherwise normal, potassium 4.0. Total cholesterol 355, triglycerides 1124, HDL 48, LDL not calculated.  Non-HDL cholesterol 307. A1c 7.3%.  TSH normal, T3 and free T4 normal.  12/19/2020: Total cholesterol 278, triglycerides 130, HDL 52, non-HDL cholesterol 226.  Allergies  No Known Allergies   Medications Prior to Visit:   Outpatient Medications Prior to Visit  Medication Sig Dispense Refill   Apple Cider Vinegar 188 MG CAPS Take by mouth.     aspirin (ASPIRIN CHILDRENS) 81 MG chewable tablet Chew 1 tablet (81 mg total) by mouth daily.     fenofibrate (TRICOR) 145 MG tablet Take 145 mg by mouth daily.     gabapentin (NEURONTIN) 300 MG capsule Take 300 mg by mouth 2 (two) times daily.     glipiZIDE (GLUCOTROL XL) 10 MG 24 hr tablet Take 10 mg by mouth daily.     ibandronate (BONIVA) 150 MG tablet Take 150 mg by mouth every 30 (thirty) days. Take in the morning with a full glass of water, on an empty stomach, and do not take anything else by mouth or lie down for the next 30 min.     levothyroxine (SYNTHROID) 75 MCG tablet TK 1 T PO QAM ON AN EMPTY STOMACH     meloxicam (MOBIC) 15 MG tablet Take 7.5 mg by mouth daily. Taking 1-2 daily as needed     atorvastatin (LIPITOR) 40 MG tablet Take 1 tablet (40 mg total) by mouth daily. 90 tablet 3   icosapent Ethyl (VASCEPA) 1 g capsule      pantoprazole (PROTONIX) 40 MG tablet Take 40 mg by mouth daily. (Patient not taking: Reported on 09/05/2021)     pioglitazone (ACTOS) 15 MG tablet TK 1 T PO D (Patient not taking: Reported on 09/05/2021)     amLODipine (NORVASC) 2.5 MG tablet Take 1 tablet by mouth daily. (Patient not taking: No sig reported)     benzonatate (TESSALON) 100 MG capsule Take 1 capsule (100 mg total) by  mouth every 8 (eight) hours. 21 capsule 0   icosapent Ethyl (VASCEPA) 1 g capsule Take 2 capsules (2 g total) by mouth 2 (two) times daily with a meal. (Patient not taking: Reported on 09/05/2021) 360 capsule 3   No facility-administered medications prior to visit.   Final Medications at End of Visit    Current Meds  Medication Sig   Apple Cider Vinegar 188 MG CAPS Take by mouth.   aspirin (ASPIRIN CHILDRENS) 81 MG chewable tablet Chew 1 tablet (81 mg total) by mouth daily.   fenofibrate (  TRICOR) 145 MG tablet Take 145 mg by mouth daily.   gabapentin (NEURONTIN) 300 MG capsule Take 300 mg by mouth 2 (two) times daily.   glipiZIDE (GLUCOTROL XL) 10 MG 24 hr tablet Take 10 mg by mouth daily.   ibandronate (BONIVA) 150 MG tablet Take 150 mg by mouth every 30 (thirty) days. Take in the morning with a full glass of water, on an empty stomach, and do not take anything else by mouth or lie down for the next 30 min.   levothyroxine (SYNTHROID) 75 MCG tablet TK 1 T PO QAM ON AN EMPTY STOMACH   meloxicam (MOBIC) 15 MG tablet Take 7.5 mg by mouth daily. Taking 1-2 daily as needed   [DISCONTINUED] atorvastatin (LIPITOR) 40 MG tablet Take 1 tablet (40 mg total) by mouth daily.   [DISCONTINUED] icosapent Ethyl (VASCEPA) 1 g capsule    Radiology:   No results found.  Cardiac Studies:   None  EKG:   EKG 06/02/2021: Normal sinus rhythm at rate of 82 bpm, normal axis, incomplete right bundle branch block.  No evidence of ischemia.    Assessment     ICD-10-CM   1. Mixed hyperlipidemia  E78.2     2. Hypertriglyceridemia  E78.1       CV risk > 20%  Medications Discontinued During This Encounter  Medication Reason   benzonatate (TESSALON) 100 MG capsule Error   icosapent Ethyl (VASCEPA) 1 g capsule Error   amLODipine (NORVASC) 2.5 MG tablet Patient has not taken in last 30 days   icosapent Ethyl (VASCEPA) 1 g capsule Patient has not taken in last 30 days   atorvastatin (LIPITOR) 40 MG tablet       Meds ordered this encounter  Medications   atorvastatin (LIPITOR) 40 MG tablet    Sig: Take 0.5 tablets (20 mg total) by mouth at bedtime.    Dispense:  90 tablet    Refill:  3   No orders of the defined types were placed in this encounter.  Recommendations:   Christine Black is a 75 y.o. 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  Patient has diabetes mellitus, but no other cardiovascular risk factors, no hypertension or family history of premature coronary disease or tobacco use disorder.  She has no clinical evidence of xanthelasma or tendon xanthoma or strong family history of premature coronary disease, recurrent pancreatitis or arthritis.  Hence familial origin to her elevated triglycerides is probably unlikely.    Patient presents for 2 month follow-up of hyperlipidemia and hypertriglyceridemia.  At last office visit started atorvastatin 40 mg daily as well as Vascepa 2 g twice daily. However patient did not start Vascepa.  Patient has however made significant diet modifications.  I personally reviewed external labs, triglycerides have improved significantly and LDL is well controlled.  Patient's triglycerides have improved and are now well controlled without Vascepa, encourage patient to continue with diet modifications and congratulated her on her efforts.  We will hold off on Vascepa at this time.  Patient reports myalgias since initiation of atorvastatin.  We will therefore reduce it from 40 mg to 20 mg nightly.  Advised patient to monitor symptoms over the next 2 weeks and if she continues to have myalgias patient may hold atorvastatin for 2 weeks and notify our office.  Could consider switching to different statin therapy if necessary.  Patient's blood pressure is also well controlled at this time although she is not taking amlodipine.  We  will hold off on reinitiation of amlodipine although could consider starting back at 2.5  mg daily if blood pressures >130/80 mmHg. Reviewed of external labs shows diabetes control has also improved.   CV risk is >20% in view of diabetes, age, and risk factors, however she remains asymptomatic. I have a low threshold to pursue further ischemic evaluation.however shared decision with patient and her daughter was to continue to hold off on further ischemic evaluation at this time.    Follow up in 1 year, sooner if needed, for hyperlipidemia and cardiovascular risk management.    Alethia Berthold, PA-C 09/05/2021, 11:53 AM Office: [phone]

## 2021-09-05 ENCOUNTER — Other Ambulatory Visit: Payer: Self-pay

## 2021-09-05 ENCOUNTER — Encounter: Payer: Self-pay | Admitting: Student

## 2021-09-05 ENCOUNTER — Ambulatory Visit: Payer: Medicare Other | Admitting: Student

## 2021-09-05 VITALS — BP 132/71 | HR 76 | Temp 98.0°F | Ht <= 58 in | Wt 121.0 lb

## 2021-09-05 DIAGNOSIS — E781 Pure hyperglyceridemia: Secondary | ICD-10-CM | POA: Diagnosis not present

## 2021-09-05 DIAGNOSIS — E782 Mixed hyperlipidemia: Secondary | ICD-10-CM

## 2021-09-05 MED ORDER — ATORVASTATIN CALCIUM 40 MG PO TABS: 20.0000 mg | ORAL_TABLET | Freq: Every evening | ORAL | 3 refills | Status: DC

## 2021-10-09 DIAGNOSIS — K591 Functional diarrhea: Secondary | ICD-10-CM | POA: Diagnosis not present

## 2021-10-09 DIAGNOSIS — I10 Essential (primary) hypertension: Secondary | ICD-10-CM | POA: Diagnosis not present

## 2021-10-09 DIAGNOSIS — N289 Disorder of kidney and ureter, unspecified: Secondary | ICD-10-CM | POA: Diagnosis not present

## 2021-10-09 DIAGNOSIS — M25561 Pain in right knee: Secondary | ICD-10-CM | POA: Diagnosis not present

## 2021-10-09 DIAGNOSIS — E039 Hypothyroidism, unspecified: Secondary | ICD-10-CM | POA: Diagnosis not present

## 2021-10-09 DIAGNOSIS — M810 Age-related osteoporosis without current pathological fracture: Secondary | ICD-10-CM | POA: Diagnosis not present

## 2021-10-09 DIAGNOSIS — E782 Mixed hyperlipidemia: Secondary | ICD-10-CM | POA: Diagnosis not present

## 2021-10-09 DIAGNOSIS — E1165 Type 2 diabetes mellitus with hyperglycemia: Secondary | ICD-10-CM | POA: Diagnosis not present

## 2021-10-09 DIAGNOSIS — K219 Gastro-esophageal reflux disease without esophagitis: Secondary | ICD-10-CM | POA: Diagnosis not present

## 2021-10-09 DIAGNOSIS — Z0001 Encounter for general adult medical examination with abnormal findings: Secondary | ICD-10-CM | POA: Diagnosis not present

## 2021-10-09 DIAGNOSIS — J302 Other seasonal allergic rhinitis: Secondary | ICD-10-CM | POA: Diagnosis not present

## 2021-10-22 DIAGNOSIS — N289 Disorder of kidney and ureter, unspecified: Secondary | ICD-10-CM | POA: Diagnosis not present

## 2021-10-22 DIAGNOSIS — E039 Hypothyroidism, unspecified: Secondary | ICD-10-CM | POA: Diagnosis not present

## 2021-10-22 DIAGNOSIS — E1165 Type 2 diabetes mellitus with hyperglycemia: Secondary | ICD-10-CM | POA: Diagnosis not present

## 2021-10-22 DIAGNOSIS — M25561 Pain in right knee: Secondary | ICD-10-CM | POA: Diagnosis not present

## 2021-10-22 DIAGNOSIS — K591 Functional diarrhea: Secondary | ICD-10-CM | POA: Diagnosis not present

## 2021-10-22 DIAGNOSIS — J302 Other seasonal allergic rhinitis: Secondary | ICD-10-CM | POA: Diagnosis not present

## 2021-10-22 DIAGNOSIS — E782 Mixed hyperlipidemia: Secondary | ICD-10-CM | POA: Diagnosis not present

## 2021-10-22 DIAGNOSIS — M810 Age-related osteoporosis without current pathological fracture: Secondary | ICD-10-CM | POA: Diagnosis not present

## 2021-10-22 DIAGNOSIS — K219 Gastro-esophageal reflux disease without esophagitis: Secondary | ICD-10-CM | POA: Diagnosis not present

## 2021-10-22 DIAGNOSIS — I10 Essential (primary) hypertension: Secondary | ICD-10-CM | POA: Diagnosis not present

## 2021-11-17 DIAGNOSIS — E1165 Type 2 diabetes mellitus with hyperglycemia: Secondary | ICD-10-CM | POA: Diagnosis not present

## 2021-11-17 DIAGNOSIS — E782 Mixed hyperlipidemia: Secondary | ICD-10-CM | POA: Diagnosis not present

## 2021-11-17 DIAGNOSIS — M810 Age-related osteoporosis without current pathological fracture: Secondary | ICD-10-CM | POA: Diagnosis not present

## 2021-11-17 DIAGNOSIS — M25561 Pain in right knee: Secondary | ICD-10-CM | POA: Diagnosis not present

## 2021-11-17 DIAGNOSIS — N289 Disorder of kidney and ureter, unspecified: Secondary | ICD-10-CM | POA: Diagnosis not present

## 2021-11-17 DIAGNOSIS — K219 Gastro-esophageal reflux disease without esophagitis: Secondary | ICD-10-CM | POA: Diagnosis not present

## 2021-11-17 DIAGNOSIS — I10 Essential (primary) hypertension: Secondary | ICD-10-CM | POA: Diagnosis not present

## 2021-11-17 DIAGNOSIS — K591 Functional diarrhea: Secondary | ICD-10-CM | POA: Diagnosis not present

## 2021-11-17 DIAGNOSIS — E039 Hypothyroidism, unspecified: Secondary | ICD-10-CM | POA: Diagnosis not present

## 2021-11-17 DIAGNOSIS — J302 Other seasonal allergic rhinitis: Secondary | ICD-10-CM | POA: Diagnosis not present

## 2021-12-01 ENCOUNTER — Ambulatory Visit: Payer: Medicare Other | Admitting: Cardiology

## 2021-12-22 DIAGNOSIS — K591 Functional diarrhea: Secondary | ICD-10-CM | POA: Diagnosis not present

## 2021-12-22 DIAGNOSIS — M810 Age-related osteoporosis without current pathological fracture: Secondary | ICD-10-CM | POA: Diagnosis not present

## 2021-12-22 DIAGNOSIS — N289 Disorder of kidney and ureter, unspecified: Secondary | ICD-10-CM | POA: Diagnosis not present

## 2021-12-22 DIAGNOSIS — M25561 Pain in right knee: Secondary | ICD-10-CM | POA: Diagnosis not present

## 2021-12-22 DIAGNOSIS — I10 Essential (primary) hypertension: Secondary | ICD-10-CM | POA: Diagnosis not present

## 2021-12-22 DIAGNOSIS — K219 Gastro-esophageal reflux disease without esophagitis: Secondary | ICD-10-CM | POA: Diagnosis not present

## 2021-12-22 DIAGNOSIS — E782 Mixed hyperlipidemia: Secondary | ICD-10-CM | POA: Diagnosis not present

## 2021-12-22 DIAGNOSIS — E039 Hypothyroidism, unspecified: Secondary | ICD-10-CM | POA: Diagnosis not present

## 2021-12-22 DIAGNOSIS — E1165 Type 2 diabetes mellitus with hyperglycemia: Secondary | ICD-10-CM | POA: Diagnosis not present

## 2021-12-22 DIAGNOSIS — J302 Other seasonal allergic rhinitis: Secondary | ICD-10-CM | POA: Diagnosis not present

## 2022-01-02 DIAGNOSIS — E1165 Type 2 diabetes mellitus with hyperglycemia: Secondary | ICD-10-CM | POA: Diagnosis not present

## 2022-01-20 DIAGNOSIS — E039 Hypothyroidism, unspecified: Secondary | ICD-10-CM | POA: Diagnosis not present

## 2022-01-20 DIAGNOSIS — M810 Age-related osteoporosis without current pathological fracture: Secondary | ICD-10-CM | POA: Diagnosis not present

## 2022-01-20 DIAGNOSIS — K591 Functional diarrhea: Secondary | ICD-10-CM | POA: Diagnosis not present

## 2022-01-20 DIAGNOSIS — E1165 Type 2 diabetes mellitus with hyperglycemia: Secondary | ICD-10-CM | POA: Diagnosis not present

## 2022-01-20 DIAGNOSIS — M25561 Pain in right knee: Secondary | ICD-10-CM | POA: Diagnosis not present

## 2022-01-20 DIAGNOSIS — N289 Disorder of kidney and ureter, unspecified: Secondary | ICD-10-CM | POA: Diagnosis not present

## 2022-01-20 DIAGNOSIS — I10 Essential (primary) hypertension: Secondary | ICD-10-CM | POA: Diagnosis not present

## 2022-01-20 DIAGNOSIS — E782 Mixed hyperlipidemia: Secondary | ICD-10-CM | POA: Diagnosis not present

## 2022-01-20 DIAGNOSIS — K219 Gastro-esophageal reflux disease without esophagitis: Secondary | ICD-10-CM | POA: Diagnosis not present

## 2022-01-20 DIAGNOSIS — J302 Other seasonal allergic rhinitis: Secondary | ICD-10-CM | POA: Diagnosis not present

## 2022-01-21 IMAGING — MG MM DIGITAL SCREENING BILAT W/ TOMO AND CAD
8 series · 9 of 24 positions shown · non-contrast
Comparison: Previous exam(s).

CLINICAL DATA: Screening.

EXAM:
DIGITAL SCREENING BILATERAL MAMMOGRAM WITH TOMOSYNTHESIS AND CAD
TECHNIQUE: Bilateral screening digital craniocaudal and mediolateral oblique
mammograms were obtained. Bilateral screening digital breast
tomosynthesis was performed. The images were evaluated with
computer-aided detection.

[R CC synth-2D]
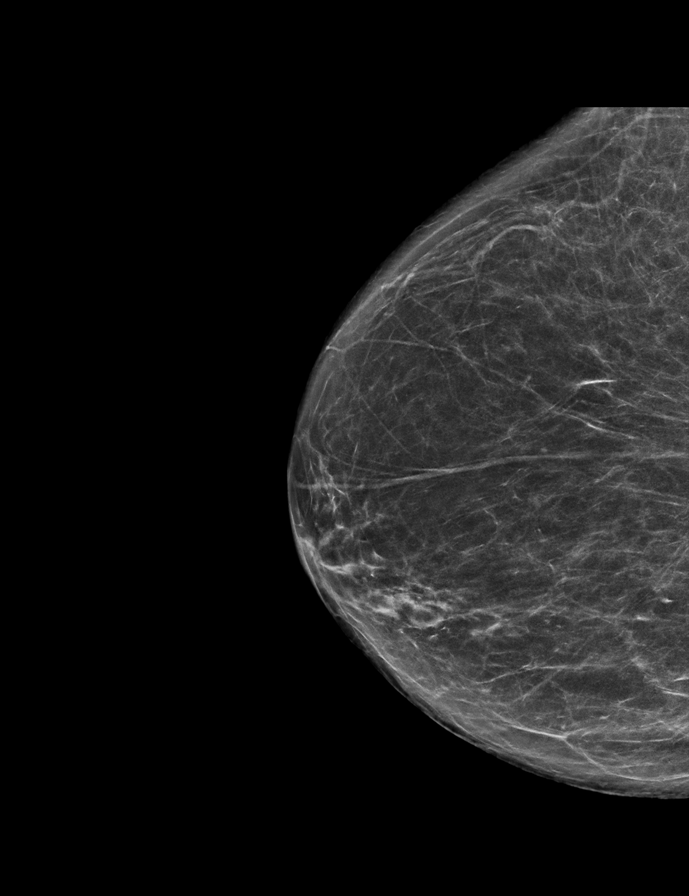

[R MLO synth-2D]
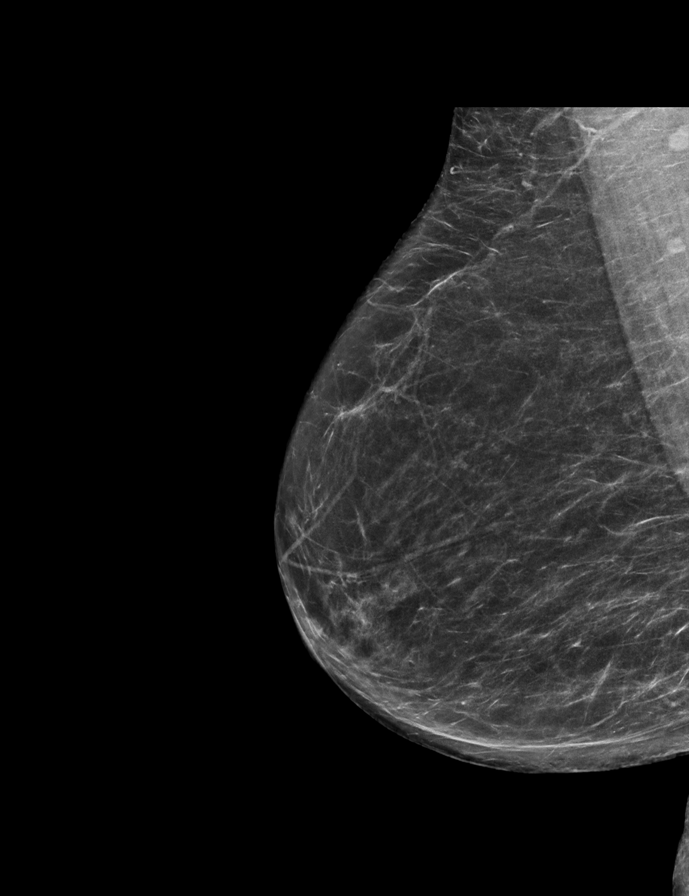

[L MLO synth-2D]
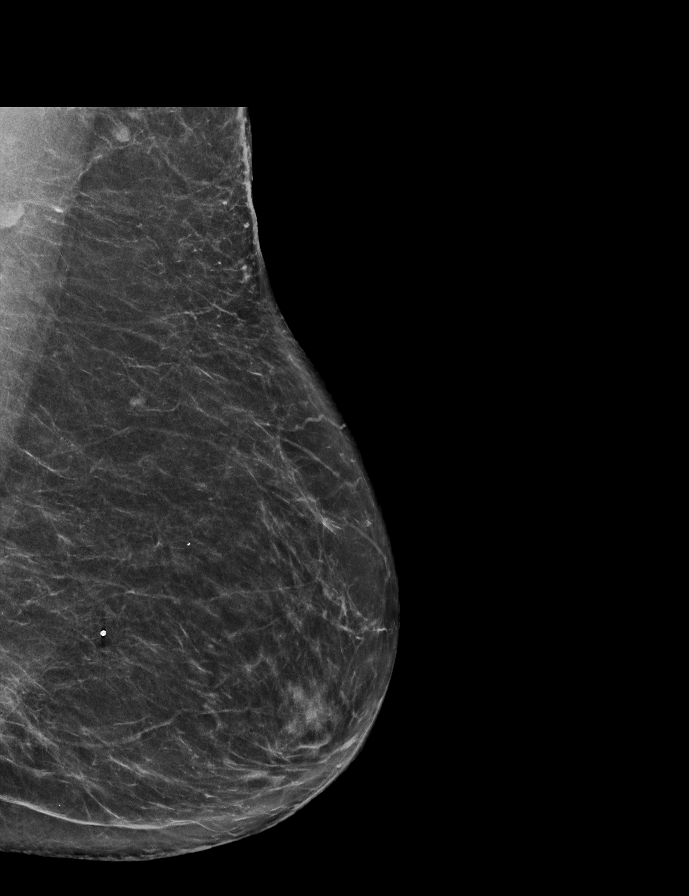

[L CC synth-2D]
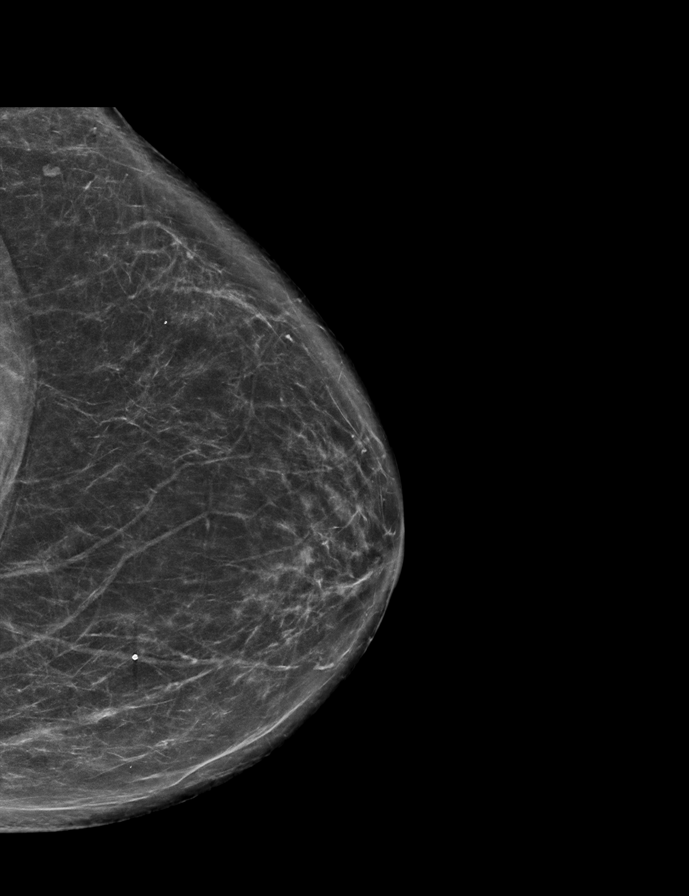

[L MLO tomo · 2 of 68 frames shown]
[frame 22/68]
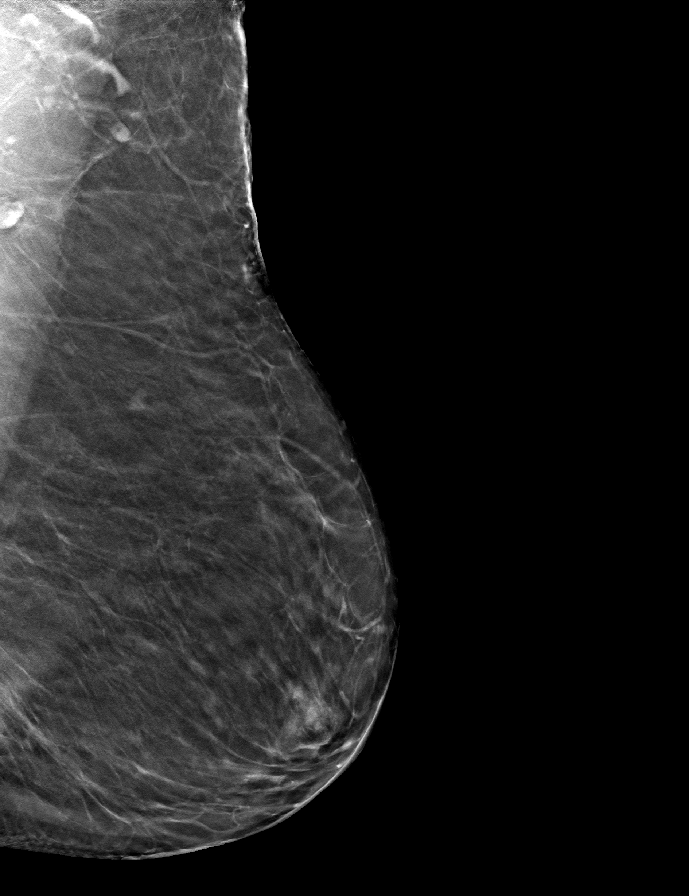
[frame 35/68]
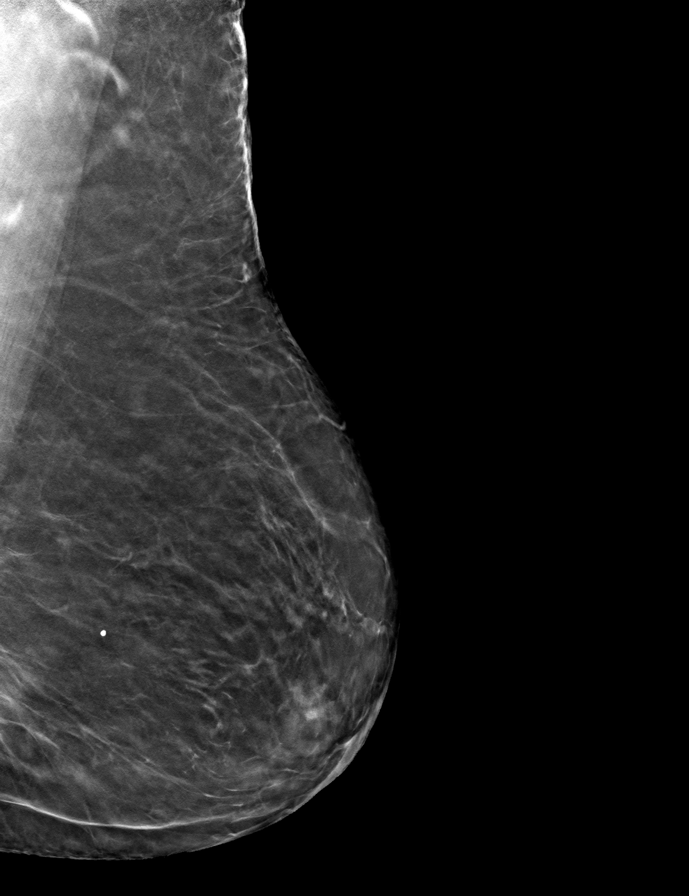

[R CC tomo · tomo slice 31/61.0]
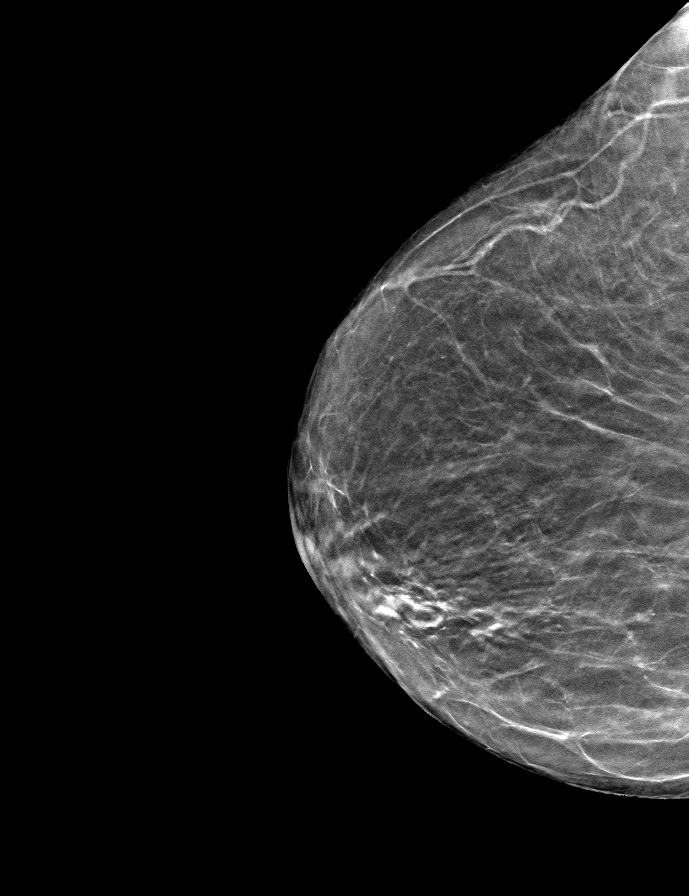

[R MLO tomo · tomo slice 33/66.0]
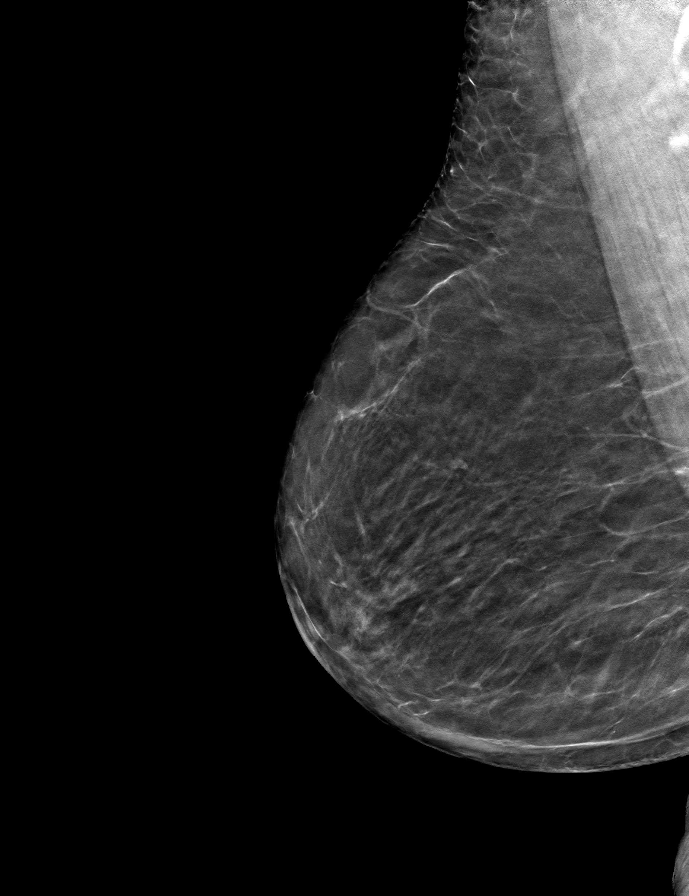

[L CC tomo · tomo slice 35/70.0]
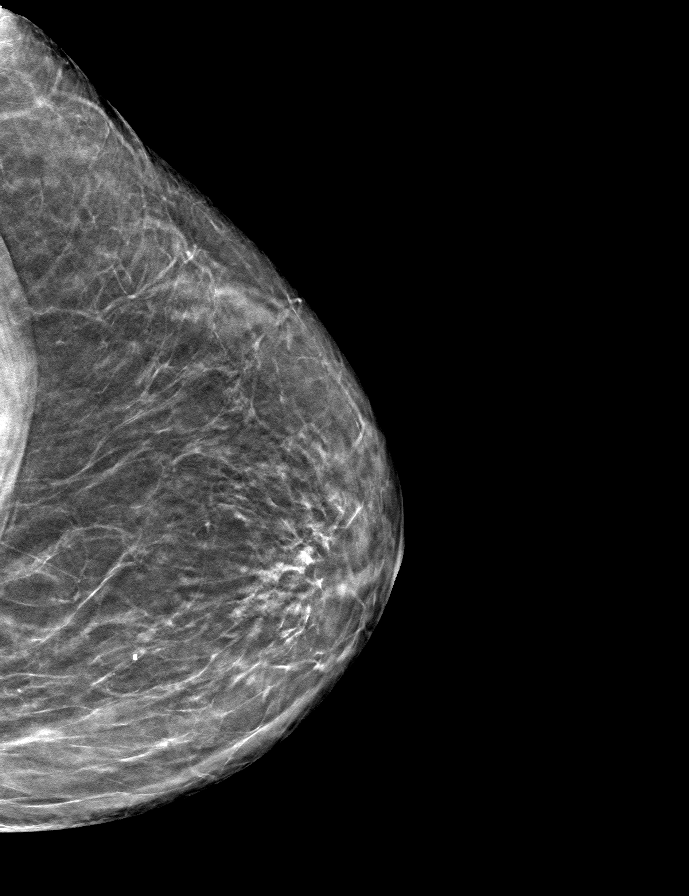

[9 of 24 positions shown; findings below may reference images not displayed]

ACR Breast Density Category b: There are scattered areas of
fibroglandular density.
FINDINGS: There are no findings suspicious for malignancy.
IMPRESSION: No mammographic evidence of malignancy. A result letter of this
screening mammogram will be mailed directly to the patient.

RECOMMENDATION:
Screening mammogram in one year. (Code:51-O-LD2)

BI-RADS CATEGORY  1: Negative.

## 2022-01-29 DIAGNOSIS — J302 Other seasonal allergic rhinitis: Secondary | ICD-10-CM | POA: Diagnosis not present

## 2022-01-29 DIAGNOSIS — E782 Mixed hyperlipidemia: Secondary | ICD-10-CM | POA: Diagnosis not present

## 2022-01-29 DIAGNOSIS — M810 Age-related osteoporosis without current pathological fracture: Secondary | ICD-10-CM | POA: Diagnosis not present

## 2022-01-29 DIAGNOSIS — K591 Functional diarrhea: Secondary | ICD-10-CM | POA: Diagnosis not present

## 2022-01-29 DIAGNOSIS — E039 Hypothyroidism, unspecified: Secondary | ICD-10-CM | POA: Diagnosis not present

## 2022-01-29 DIAGNOSIS — M25561 Pain in right knee: Secondary | ICD-10-CM | POA: Diagnosis not present

## 2022-01-29 DIAGNOSIS — K219 Gastro-esophageal reflux disease without esophagitis: Secondary | ICD-10-CM | POA: Diagnosis not present

## 2022-01-29 DIAGNOSIS — N289 Disorder of kidney and ureter, unspecified: Secondary | ICD-10-CM | POA: Diagnosis not present

## 2022-01-29 DIAGNOSIS — E1165 Type 2 diabetes mellitus with hyperglycemia: Secondary | ICD-10-CM | POA: Diagnosis not present

## 2022-01-29 DIAGNOSIS — I10 Essential (primary) hypertension: Secondary | ICD-10-CM | POA: Diagnosis not present

## 2022-02-17 DIAGNOSIS — E1165 Type 2 diabetes mellitus with hyperglycemia: Secondary | ICD-10-CM | POA: Diagnosis not present

## 2022-02-17 DIAGNOSIS — M810 Age-related osteoporosis without current pathological fracture: Secondary | ICD-10-CM | POA: Diagnosis not present

## 2022-02-17 DIAGNOSIS — K591 Functional diarrhea: Secondary | ICD-10-CM | POA: Diagnosis not present

## 2022-02-17 DIAGNOSIS — I10 Essential (primary) hypertension: Secondary | ICD-10-CM | POA: Diagnosis not present

## 2022-02-17 DIAGNOSIS — J302 Other seasonal allergic rhinitis: Secondary | ICD-10-CM | POA: Diagnosis not present

## 2022-02-17 DIAGNOSIS — K219 Gastro-esophageal reflux disease without esophagitis: Secondary | ICD-10-CM | POA: Diagnosis not present

## 2022-02-17 DIAGNOSIS — E782 Mixed hyperlipidemia: Secondary | ICD-10-CM | POA: Diagnosis not present

## 2022-02-17 DIAGNOSIS — M25561 Pain in right knee: Secondary | ICD-10-CM | POA: Diagnosis not present

## 2022-02-17 DIAGNOSIS — N289 Disorder of kidney and ureter, unspecified: Secondary | ICD-10-CM | POA: Diagnosis not present

## 2022-02-17 DIAGNOSIS — E039 Hypothyroidism, unspecified: Secondary | ICD-10-CM | POA: Diagnosis not present

## 2022-03-10 DIAGNOSIS — M810 Age-related osteoporosis without current pathological fracture: Secondary | ICD-10-CM | POA: Diagnosis not present

## 2022-03-10 DIAGNOSIS — E782 Mixed hyperlipidemia: Secondary | ICD-10-CM | POA: Diagnosis not present

## 2022-03-10 DIAGNOSIS — I10 Essential (primary) hypertension: Secondary | ICD-10-CM | POA: Diagnosis not present

## 2022-03-10 DIAGNOSIS — M25561 Pain in right knee: Secondary | ICD-10-CM | POA: Diagnosis not present

## 2022-03-10 DIAGNOSIS — K591 Functional diarrhea: Secondary | ICD-10-CM | POA: Diagnosis not present

## 2022-03-10 DIAGNOSIS — E1165 Type 2 diabetes mellitus with hyperglycemia: Secondary | ICD-10-CM | POA: Diagnosis not present

## 2022-03-10 DIAGNOSIS — K219 Gastro-esophageal reflux disease without esophagitis: Secondary | ICD-10-CM | POA: Diagnosis not present

## 2022-03-10 DIAGNOSIS — N289 Disorder of kidney and ureter, unspecified: Secondary | ICD-10-CM | POA: Diagnosis not present

## 2022-03-10 DIAGNOSIS — E039 Hypothyroidism, unspecified: Secondary | ICD-10-CM | POA: Diagnosis not present

## 2022-03-10 DIAGNOSIS — J302 Other seasonal allergic rhinitis: Secondary | ICD-10-CM | POA: Diagnosis not present

## 2022-03-23 ENCOUNTER — Other Ambulatory Visit: Payer: Self-pay | Admitting: Physician Assistant

## 2022-03-23 DIAGNOSIS — M810 Age-related osteoporosis without current pathological fracture: Secondary | ICD-10-CM | POA: Diagnosis not present

## 2022-03-23 DIAGNOSIS — N289 Disorder of kidney and ureter, unspecified: Secondary | ICD-10-CM | POA: Diagnosis not present

## 2022-03-23 DIAGNOSIS — E1165 Type 2 diabetes mellitus with hyperglycemia: Secondary | ICD-10-CM | POA: Diagnosis not present

## 2022-03-23 DIAGNOSIS — M25561 Pain in right knee: Secondary | ICD-10-CM | POA: Diagnosis not present

## 2022-03-23 DIAGNOSIS — I10 Essential (primary) hypertension: Secondary | ICD-10-CM | POA: Diagnosis not present

## 2022-03-23 DIAGNOSIS — R5381 Other malaise: Secondary | ICD-10-CM

## 2022-03-23 DIAGNOSIS — E039 Hypothyroidism, unspecified: Secondary | ICD-10-CM | POA: Diagnosis not present

## 2022-03-23 DIAGNOSIS — E782 Mixed hyperlipidemia: Secondary | ICD-10-CM | POA: Diagnosis not present

## 2022-03-23 DIAGNOSIS — J302 Other seasonal allergic rhinitis: Secondary | ICD-10-CM | POA: Diagnosis not present

## 2022-03-23 DIAGNOSIS — K219 Gastro-esophageal reflux disease without esophagitis: Secondary | ICD-10-CM | POA: Diagnosis not present

## 2022-03-24 ENCOUNTER — Other Ambulatory Visit: Payer: Self-pay | Admitting: Physician Assistant

## 2022-03-24 DIAGNOSIS — M810 Age-related osteoporosis without current pathological fracture: Secondary | ICD-10-CM

## 2022-04-02 DIAGNOSIS — M25561 Pain in right knee: Secondary | ICD-10-CM | POA: Diagnosis not present

## 2022-04-02 DIAGNOSIS — E1165 Type 2 diabetes mellitus with hyperglycemia: Secondary | ICD-10-CM | POA: Diagnosis not present

## 2022-04-02 DIAGNOSIS — N289 Disorder of kidney and ureter, unspecified: Secondary | ICD-10-CM | POA: Diagnosis not present

## 2022-04-02 DIAGNOSIS — I10 Essential (primary) hypertension: Secondary | ICD-10-CM | POA: Diagnosis not present

## 2022-04-02 DIAGNOSIS — E039 Hypothyroidism, unspecified: Secondary | ICD-10-CM | POA: Diagnosis not present

## 2022-04-02 DIAGNOSIS — K219 Gastro-esophageal reflux disease without esophagitis: Secondary | ICD-10-CM | POA: Diagnosis not present

## 2022-04-02 DIAGNOSIS — M810 Age-related osteoporosis without current pathological fracture: Secondary | ICD-10-CM | POA: Diagnosis not present

## 2022-04-02 DIAGNOSIS — J302 Other seasonal allergic rhinitis: Secondary | ICD-10-CM | POA: Diagnosis not present

## 2022-04-02 DIAGNOSIS — E782 Mixed hyperlipidemia: Secondary | ICD-10-CM | POA: Diagnosis not present

## 2022-04-09 DIAGNOSIS — M7062 Trochanteric bursitis, left hip: Secondary | ICD-10-CM | POA: Diagnosis not present

## 2022-04-09 DIAGNOSIS — M5451 Vertebrogenic low back pain: Secondary | ICD-10-CM | POA: Diagnosis not present

## 2022-04-09 DIAGNOSIS — M5442 Lumbago with sciatica, left side: Secondary | ICD-10-CM | POA: Diagnosis not present

## 2022-04-13 DIAGNOSIS — E119 Type 2 diabetes mellitus without complications: Secondary | ICD-10-CM | POA: Diagnosis not present

## 2022-04-24 ENCOUNTER — Other Ambulatory Visit: Payer: Self-pay | Admitting: Nephrology

## 2022-04-24 DIAGNOSIS — N1832 Chronic kidney disease, stage 3b: Secondary | ICD-10-CM

## 2022-04-24 DIAGNOSIS — E039 Hypothyroidism, unspecified: Secondary | ICD-10-CM

## 2022-04-24 DIAGNOSIS — M810 Age-related osteoporosis without current pathological fracture: Secondary | ICD-10-CM

## 2022-04-24 DIAGNOSIS — E1122 Type 2 diabetes mellitus with diabetic chronic kidney disease: Secondary | ICD-10-CM | POA: Diagnosis not present

## 2022-04-24 DIAGNOSIS — I129 Hypertensive chronic kidney disease with stage 1 through stage 4 chronic kidney disease, or unspecified chronic kidney disease: Secondary | ICD-10-CM

## 2022-04-24 DIAGNOSIS — M1990 Unspecified osteoarthritis, unspecified site: Secondary | ICD-10-CM | POA: Diagnosis not present

## 2022-04-24 DIAGNOSIS — E785 Hyperlipidemia, unspecified: Secondary | ICD-10-CM

## 2022-05-01 ENCOUNTER — Other Ambulatory Visit: Payer: Medicare Other

## 2022-05-01 ENCOUNTER — Ambulatory Visit
Admission: RE | Admit: 2022-05-01 | Discharge: 2022-05-01 | Disposition: A | Discharge status: Home / Self Care | Payer: Medicare Other | Source: Ambulatory Visit | Attending: Nephrology | Admitting: Nephrology

## 2022-05-01 DIAGNOSIS — E039 Hypothyroidism, unspecified: Secondary | ICD-10-CM

## 2022-05-01 DIAGNOSIS — M810 Age-related osteoporosis without current pathological fracture: Secondary | ICD-10-CM

## 2022-05-01 DIAGNOSIS — N1832 Chronic kidney disease, stage 3b: Secondary | ICD-10-CM

## 2022-05-01 DIAGNOSIS — I129 Hypertensive chronic kidney disease with stage 1 through stage 4 chronic kidney disease, or unspecified chronic kidney disease: Secondary | ICD-10-CM

## 2022-05-01 DIAGNOSIS — K8020 Calculus of gallbladder without cholecystitis without obstruction: Secondary | ICD-10-CM | POA: Diagnosis not present

## 2022-05-01 DIAGNOSIS — E785 Hyperlipidemia, unspecified: Secondary | ICD-10-CM

## 2022-05-01 DIAGNOSIS — M1990 Unspecified osteoarthritis, unspecified site: Secondary | ICD-10-CM

## 2022-05-01 DIAGNOSIS — N261 Atrophy of kidney (terminal): Secondary | ICD-10-CM | POA: Diagnosis not present

## 2022-05-01 DIAGNOSIS — E1122 Type 2 diabetes mellitus with diabetic chronic kidney disease: Secondary | ICD-10-CM

## 2022-05-04 DIAGNOSIS — N1832 Chronic kidney disease, stage 3b: Secondary | ICD-10-CM | POA: Diagnosis not present

## 2022-05-20 DIAGNOSIS — E782 Mixed hyperlipidemia: Secondary | ICD-10-CM | POA: Diagnosis not present

## 2022-05-20 DIAGNOSIS — E039 Hypothyroidism, unspecified: Secondary | ICD-10-CM | POA: Diagnosis not present

## 2022-05-20 DIAGNOSIS — K219 Gastro-esophageal reflux disease without esophagitis: Secondary | ICD-10-CM | POA: Diagnosis not present

## 2022-05-20 DIAGNOSIS — M25561 Pain in right knee: Secondary | ICD-10-CM | POA: Diagnosis not present

## 2022-05-20 DIAGNOSIS — E1165 Type 2 diabetes mellitus with hyperglycemia: Secondary | ICD-10-CM | POA: Diagnosis not present

## 2022-05-20 DIAGNOSIS — N289 Disorder of kidney and ureter, unspecified: Secondary | ICD-10-CM | POA: Diagnosis not present

## 2022-05-20 DIAGNOSIS — M810 Age-related osteoporosis without current pathological fracture: Secondary | ICD-10-CM | POA: Diagnosis not present

## 2022-05-20 DIAGNOSIS — J302 Other seasonal allergic rhinitis: Secondary | ICD-10-CM | POA: Diagnosis not present

## 2022-05-20 DIAGNOSIS — I10 Essential (primary) hypertension: Secondary | ICD-10-CM | POA: Diagnosis not present

## 2022-06-03 DIAGNOSIS — M85851 Other specified disorders of bone density and structure, right thigh: Secondary | ICD-10-CM | POA: Diagnosis not present

## 2022-06-03 DIAGNOSIS — M810 Age-related osteoporosis without current pathological fracture: Secondary | ICD-10-CM | POA: Diagnosis not present

## 2022-06-03 DIAGNOSIS — M85852 Other specified disorders of bone density and structure, left thigh: Secondary | ICD-10-CM | POA: Diagnosis not present

## 2022-06-03 DIAGNOSIS — Z780 Asymptomatic menopausal state: Secondary | ICD-10-CM | POA: Diagnosis not present

## 2022-06-11 DIAGNOSIS — M7062 Trochanteric bursitis, left hip: Secondary | ICD-10-CM | POA: Diagnosis not present

## 2022-06-11 DIAGNOSIS — M5416 Radiculopathy, lumbar region: Secondary | ICD-10-CM | POA: Diagnosis not present

## 2022-06-17 DIAGNOSIS — M5412 Radiculopathy, cervical region: Secondary | ICD-10-CM | POA: Diagnosis not present

## 2022-06-17 DIAGNOSIS — M7062 Trochanteric bursitis, left hip: Secondary | ICD-10-CM | POA: Diagnosis not present

## 2022-06-19 DIAGNOSIS — M5416 Radiculopathy, lumbar region: Secondary | ICD-10-CM | POA: Diagnosis not present

## 2022-06-19 DIAGNOSIS — M7062 Trochanteric bursitis, left hip: Secondary | ICD-10-CM | POA: Diagnosis not present

## 2022-06-22 DIAGNOSIS — M7062 Trochanteric bursitis, left hip: Secondary | ICD-10-CM | POA: Diagnosis not present

## 2022-06-22 DIAGNOSIS — M5416 Radiculopathy, lumbar region: Secondary | ICD-10-CM | POA: Diagnosis not present

## 2022-06-23 DIAGNOSIS — I10 Essential (primary) hypertension: Secondary | ICD-10-CM | POA: Diagnosis not present

## 2022-06-23 DIAGNOSIS — E782 Mixed hyperlipidemia: Secondary | ICD-10-CM | POA: Diagnosis not present

## 2022-06-23 DIAGNOSIS — E1165 Type 2 diabetes mellitus with hyperglycemia: Secondary | ICD-10-CM | POA: Diagnosis not present

## 2022-06-23 DIAGNOSIS — J302 Other seasonal allergic rhinitis: Secondary | ICD-10-CM | POA: Diagnosis not present

## 2022-06-23 DIAGNOSIS — M810 Age-related osteoporosis without current pathological fracture: Secondary | ICD-10-CM | POA: Diagnosis not present

## 2022-06-23 DIAGNOSIS — Z0000 Encounter for general adult medical examination without abnormal findings: Secondary | ICD-10-CM | POA: Diagnosis not present

## 2022-06-23 DIAGNOSIS — K219 Gastro-esophageal reflux disease without esophagitis: Secondary | ICD-10-CM | POA: Diagnosis not present

## 2022-06-23 DIAGNOSIS — E039 Hypothyroidism, unspecified: Secondary | ICD-10-CM | POA: Diagnosis not present

## 2022-06-24 DIAGNOSIS — M5416 Radiculopathy, lumbar region: Secondary | ICD-10-CM | POA: Diagnosis not present

## 2022-06-24 DIAGNOSIS — M7062 Trochanteric bursitis, left hip: Secondary | ICD-10-CM | POA: Diagnosis not present

## 2022-06-25 DIAGNOSIS — K219 Gastro-esophageal reflux disease without esophagitis: Secondary | ICD-10-CM | POA: Diagnosis not present

## 2022-06-25 DIAGNOSIS — I10 Essential (primary) hypertension: Secondary | ICD-10-CM | POA: Diagnosis not present

## 2022-06-25 DIAGNOSIS — E782 Mixed hyperlipidemia: Secondary | ICD-10-CM | POA: Diagnosis not present

## 2022-06-25 DIAGNOSIS — E1165 Type 2 diabetes mellitus with hyperglycemia: Secondary | ICD-10-CM | POA: Diagnosis not present

## 2022-06-25 DIAGNOSIS — E039 Hypothyroidism, unspecified: Secondary | ICD-10-CM | POA: Diagnosis not present

## 2022-06-30 DIAGNOSIS — M5416 Radiculopathy, lumbar region: Secondary | ICD-10-CM | POA: Diagnosis not present

## 2022-06-30 DIAGNOSIS — M7062 Trochanteric bursitis, left hip: Secondary | ICD-10-CM | POA: Diagnosis not present

## 2022-07-02 DIAGNOSIS — E1165 Type 2 diabetes mellitus with hyperglycemia: Secondary | ICD-10-CM | POA: Diagnosis not present

## 2022-07-03 DIAGNOSIS — M5416 Radiculopathy, lumbar region: Secondary | ICD-10-CM | POA: Diagnosis not present

## 2022-07-03 DIAGNOSIS — M7062 Trochanteric bursitis, left hip: Secondary | ICD-10-CM | POA: Diagnosis not present

## 2022-07-07 DIAGNOSIS — M7062 Trochanteric bursitis, left hip: Secondary | ICD-10-CM | POA: Diagnosis not present

## 2022-07-07 DIAGNOSIS — M5416 Radiculopathy, lumbar region: Secondary | ICD-10-CM | POA: Diagnosis not present

## 2022-07-08 ENCOUNTER — Encounter: Payer: Self-pay | Admitting: Student

## 2022-07-08 ENCOUNTER — Ambulatory Visit (INDEPENDENT_AMBULATORY_CARE_PROVIDER_SITE_OTHER): Payer: Medicare Other | Admitting: Student

## 2022-07-08 VITALS — BP 124/68 | HR 75 | Ht <= 58 in | Wt 117.8 lb

## 2022-07-08 DIAGNOSIS — N898 Other specified noninflammatory disorders of vagina: Secondary | ICD-10-CM | POA: Diagnosis not present

## 2022-07-08 NOTE — Progress Notes (Signed)
Reports reoccurring "bump" in vaginal area, TX with ABX multiple times by PCP, referral for "removal". Denies vaginal dryness or irritation.  Recent DX of decreased renal function, awaiting nephrology consult

## 2022-07-09 DIAGNOSIS — M5416 Radiculopathy, lumbar region: Secondary | ICD-10-CM | POA: Diagnosis not present

## 2022-07-09 DIAGNOSIS — M7062 Trochanteric bursitis, left hip: Secondary | ICD-10-CM | POA: Diagnosis not present

## 2022-07-10 NOTE — Progress Notes (Signed)
  History:  Ms. Christine Black is a 76 y.o. No obstetric history on file. who presents to clinic today for reoccurring "bump" in vaginal area. Patient reports feeling bump one day in April while taking a shower. Has received treatment with antibiotics twice by PCP. Patient was referred here for "removal". Denies any pain or irritation associated with bump since it was discovered. Reports bump has not increased in size since completing last dose of antibiotics.    The following portions of the patient's history were reviewed and updated as appropriate: allergies, current medications, family history, past medical history, social history, past surgical history and problem list.  Review of Systems:  Review of Systems  Constitutional:  Negative for chills, fever and malaise/fatigue.  Gastrointestinal:  Negative for nausea and vomiting.  Genitourinary:  Negative for dysuria.  Skin:  Negative for itching and rash.       Small bump in vaginal area      Objective:  Physical Exam BP 124/68   Pulse 75   Ht 4\' 7"  (1.397 m)   Wt 117 lb 12.8 oz (53.4 kg)   BMI 27.38 kg/m  Physical Exam Constitutional:      Appearance: Normal appearance. She is normal weight.  Abdominal:     Palpations: Abdomen is soft.     Tenderness: There is no abdominal tenderness.  Genitourinary:    General: Normal vulva.     Vagina: No vaginal discharge.     Comments: Small(~1cm) nodule/bump present on lower right labia majora, non-inflammatory in appearance and with mild hyperpigmentation noted Skin:    General: Skin is warm and dry.     Findings: No erythema or rash.     Comments: Vaginal lesion  Neurological:     Mental Status: She is alert.       Labs and Imaging No results found for this or any previous visit (from the past 24 hour(s)).  No results found.   Assessment & Plan:  1. Vaginal lesion - Assessment completed with assistance of Dr. and noted to be benign finding - Discussed with patient  that bump appears to be residual scarring from  prior folliculitis. Due to bump remaining stable in size since last treatment of antibiotics and no signs of infection or pain, patient instructed that removal of lesion would be more damaging and risky than the current presentation.Instructed to monitor site for any worsening symptoms and return if needed.  Approximately 30 minutes of face-to-face time was spent with this patient   Donavan Foil, NP

## 2022-07-16 DIAGNOSIS — E785 Hyperlipidemia, unspecified: Secondary | ICD-10-CM | POA: Diagnosis not present

## 2022-07-16 DIAGNOSIS — M810 Age-related osteoporosis without current pathological fracture: Secondary | ICD-10-CM | POA: Diagnosis not present

## 2022-07-16 DIAGNOSIS — N1832 Chronic kidney disease, stage 3b: Secondary | ICD-10-CM | POA: Diagnosis not present

## 2022-07-16 DIAGNOSIS — E1122 Type 2 diabetes mellitus with diabetic chronic kidney disease: Secondary | ICD-10-CM | POA: Diagnosis not present

## 2022-07-16 DIAGNOSIS — I129 Hypertensive chronic kidney disease with stage 1 through stage 4 chronic kidney disease, or unspecified chronic kidney disease: Secondary | ICD-10-CM | POA: Diagnosis not present

## 2022-08-06 DIAGNOSIS — K219 Gastro-esophageal reflux disease without esophagitis: Secondary | ICD-10-CM | POA: Diagnosis not present

## 2022-08-06 DIAGNOSIS — J302 Other seasonal allergic rhinitis: Secondary | ICD-10-CM | POA: Diagnosis not present

## 2022-08-06 DIAGNOSIS — I10 Essential (primary) hypertension: Secondary | ICD-10-CM | POA: Diagnosis not present

## 2022-08-06 DIAGNOSIS — E782 Mixed hyperlipidemia: Secondary | ICD-10-CM | POA: Diagnosis not present

## 2022-08-06 DIAGNOSIS — M810 Age-related osteoporosis without current pathological fracture: Secondary | ICD-10-CM | POA: Diagnosis not present

## 2022-08-06 DIAGNOSIS — E039 Hypothyroidism, unspecified: Secondary | ICD-10-CM | POA: Diagnosis not present

## 2022-08-20 ENCOUNTER — Encounter: Payer: Self-pay | Admitting: Cardiology

## 2022-08-20 ENCOUNTER — Ambulatory Visit: Payer: Medicare Other | Admitting: Cardiology

## 2022-08-20 VITALS — BP 122/62 | HR 78 | Temp 98.0°F | Resp 16 | Ht <= 58 in | Wt 119.0 lb

## 2022-08-20 DIAGNOSIS — E119 Type 2 diabetes mellitus without complications: Secondary | ICD-10-CM

## 2022-08-20 DIAGNOSIS — E559 Vitamin D deficiency, unspecified: Secondary | ICD-10-CM

## 2022-08-20 DIAGNOSIS — E782 Mixed hyperlipidemia: Secondary | ICD-10-CM | POA: Diagnosis not present

## 2022-08-20 MED ORDER — ATORVASTATIN CALCIUM 20 MG PO TABS: 20.0000 mg | ORAL_TABLET | Freq: Every evening | ORAL | 2 refills | Status: DC

## 2022-08-20 MED ORDER — ATORVASTATIN CALCIUM 20 MG PO TABS
20.0000 mg | ORAL_TABLET | Freq: Every evening | ORAL | 2 refills | Status: DC
Start: 2022-08-20 — End: 2022-10-22

## 2022-08-20 NOTE — Progress Notes (Signed)
Primary Physician/Referring:  Jackie Plum, MD  Patient ID: Christine Black, female    DOB: 04/25/1946, 76 y.o.   MRN: [id number]  Chief Complaint  Patient presents with   Hyperlipidemia   Follow-up   HPI:    Christine Black  is a 76 y.o. [place] origin female patient referred to me for evaluation of mixed hyperlipidemia.  Patient has diabetes mellitus, but no other cardiovascular risk factors, no hypertension or family history of premature coronary disease or tobacco use disorder. She walks daily without chest pain, dyspnea, claudication.  She is referred back to [place] for management of hyperlipidemia.  Patient is accompanied by her daughter at today's office visit.   Past Medical History:  Diagnosis Date   Diabetes mellitus without complication ([facility])    Hyperlipidemia    Thyroid disease    Past Surgical History:  Procedure Laterality Date   ROTATOR CUFF REPAIR Bilateral    Family History  Problem Relation Age of Onset   Arthritis Mother    Heart attack Father 38   Arthritis Brother    Heart disease Brother    Lung disease Brother    Breast cancer Neg Hx     Social History   Tobacco Use   Smoking status: Never   Smokeless tobacco: Never  Substance Use Topics   Alcohol use: Not Currently   Marital Status: Widowed  ROS  Review of Systems  Cardiovascular:  Negative for chest pain, dyspnea on exertion and leg swelling.   Objective  Blood pressure 122/62, pulse 78, temperature 98 F (36.7 C), resp. rate 16, height 4\' 7"  (1.397 m), weight 119 lb (54 kg), SpO2 98 %. Body mass index is 27.66 kg/m.      08/20/2022    1:19 PM 07/08/2022    3:20 PM 09/05/2021   11:12 AM  Vitals with BMI  Height 4\' 7"  4\' 7"  4\' 7"   Weight 119 lbs 117 lbs 13 oz 121 lbs  BMI 27.66 27.38 28.12  Systolic 122 124 09/07/2021  Diastolic 62 68 71  Pulse 78 75 76     Physical Exam Neck:     Vascular: No carotid bruit or JVD.  Cardiovascular:     Rate and Rhythm: Normal rate and regular rhythm.      Pulses: Intact distal pulses.     Heart sounds: Normal heart sounds. No murmur heard.    No gallop.  Pulmonary:     Effort: Pulmonary effort is normal.     Breath sounds: Normal breath sounds.  Abdominal:     General: Bowel sounds are normal.     Palpations: Abdomen is soft.  Musculoskeletal:     Right lower leg: No edema.     Left lower leg: No edema.     Laboratory examination:   Lipid Panel     Component Value Date/Time   CHOL 261 (H) 06/05/2021 0810   TRIG 188 (H) 06/05/2021 0810   HDL 63 06/05/2021 0810   LDLCALC 164 (H) 06/05/2021 0810   LDLDIRECT 161 (H) 06/05/2021 0810   LABVLDL 34 06/05/2021 0810     External labs    Labs 06/26/2022:  Total cholesterol 216, triglycerides 261, HDL 55, LDL 121.  Non-HDL cholesterol 161.  A1C 6.400 03/24/2022  Labs 08/20/2021:  Total cholesterol 152, triglycerides 110, HDL 57, LDL 75.  Non-HDL cholesterol 95. Allergies   Allergies  Allergen Reactions   Nsaids Other (See Comments)    Renal dysfunction    Final Medications at End of  Visit     Current Outpatient Medications:    fenofibrate (TRICOR) 145 MG tablet, Take 145 mg by mouth daily., Disp: , Rfl:    glipiZIDE (GLUCOTROL XL) 10 MG 24 hr tablet, Take 10 mg by mouth daily., Disp: , Rfl:    ibandronate (BONIVA) 150 MG tablet, Take 150 mg by mouth every 30 (thirty) days. Take in the morning with a full glass of water, on an empty stomach, and do not take anything else by mouth or lie down for the next 30 min., Disp: , Rfl:    JARDIANCE 10 MG TABS tablet, Take 10 mg by mouth daily., Disp: , Rfl:    levothyroxine (SYNTHROID) 75 MCG tablet, Take 75 mcg by mouth daily before breakfast., Disp: , Rfl:    losartan (COZAAR) 25 MG tablet, Take 25 mg by mouth daily., Disp: , Rfl:    montelukast (SINGULAIR) 10 MG tablet, Take 10 mg by mouth daily., Disp: , Rfl:    amLODipine (NORVASC) 2.5 MG tablet, Take 2.5 mg by mouth daily. (Patient not taking: Reported on 08/20/2022), Disp: ,  Rfl:    Apple Cider Vinegar 188 MG CAPS, Take by mouth. (Patient not taking: Reported on 08/20/2022), Disp: , Rfl:    atorvastatin (LIPITOR) 20 MG tablet, Take 1 tablet (20 mg total) by mouth at bedtime., Disp: 30 tablet, Rfl: 2  Radiology:   No results found.  Cardiac Studies:   None  EKG:   EKG 06/02/2021: Normal sinus rhythm at rate of 82 bpm, normal axis, incomplete right bundle branch block.  No evidence of ischemia.    Assessment     ICD-10-CM   1. Mixed hyperlipidemia  E78.2 EKG 12-Lead    atorvastatin (LIPITOR) 20 MG tablet    Lipid Panel With LDL/HDL Ratio    DISCONTINUED: atorvastatin (LIPITOR) 20 MG tablet    DISCONTINUED: atorvastatin (LIPITOR) 20 MG tablet    2. Type 2 diabetes mellitus without complication, without long-term current use of insulin (HCC)  E11.9     3. Hypovitaminosis D  E55.9 VITAMIN D 25 Hydroxy (Vit-D Deficiency, Fractures)      CV risk > 20%  Medications Discontinued During This Encounter  Medication Reason   meloxicam (MOBIC) 15 MG tablet No longer needed (for PRN medications)   pantoprazole (PROTONIX) 40 MG tablet No longer needed (for PRN medications)   pioglitazone (ACTOS) 15 MG tablet Patient has not taken in last 30 days   aspirin (ASPIRIN CHILDRENS) 81 MG chewable tablet Discontinued by provider   esomeprazole (NEXIUM) 40 MG capsule No longer needed (for PRN medications)   gabapentin (NEURONTIN) 300 MG capsule No longer needed (for PRN medications)   pantoprazole (PROTONIX) 40 MG tablet No longer needed (for PRN medications)   meloxicam (MOBIC) 15 MG tablet No longer needed (for PRN medications)   pantoprazole (PROTONIX) 40 MG tablet No longer needed (for PRN medications)   pioglitazone (ACTOS) 15 MG tablet Patient has not taken in last 30 days   LIVALO 1 MG TABS Ineffective   atorvastatin (LIPITOR) 40 MG tablet Reorder   atorvastatin (LIPITOR) 20 MG tablet    atorvastatin (LIPITOR) 20 MG tablet      Meds ordered this encounter   Medications   DISCONTD: atorvastatin (LIPITOR) 20 MG tablet    Sig: Take 1 tablet (20 mg total) by mouth at bedtime.    Dispense:  30 tablet    Refill:  2   DISCONTD: atorvastatin (LIPITOR) 20 MG tablet    Sig: Take  1 tablet (20 mg total) by mouth at bedtime.    Dispense:  30 tablet    Refill:  2   atorvastatin (LIPITOR) 20 MG tablet    Sig: Take 1 tablet (20 mg total) by mouth at bedtime.    Dispense:  30 tablet    Refill:  2   Orders Placed This Encounter  Procedures   Lipid Panel With LDL/HDL Ratio   VITAMIN D 25 Hydroxy (Vit-D Deficiency, Fractures)   EKG 12-Lead   Recommendations:   Christine Black is a 76 y.o. [place] origin female patient referred to me for evaluation of mixed hyperlipidemia.  Patient has diabetes mellitus, but no other cardiovascular risk factors, no hypertension or family history of premature coronary disease or tobacco use disorder. She walks daily without chest pain, dyspnea, claudication.  She is referred back to [place] for management of hyperlipidemia.  When she was last seen by [place] about 6 months ago, her lipids were under excellent control.  However, Lipitor 20 mg has been discontinued and she was switched over to Livalo 1 mg.  After review of the lipid profile, her lipids were under excellent control.  It was extremely difficult to obtain any history to suggest that she was intolerant to atorvastatin.  She does have chronic arthritis due to her age and also osteopenia.  She is presently taking vitamin D but does not know the dosage.  I will obtain vitamin D levels.  Discontinue Livalo and start back atorvastatin 20 mg daily, recheck lipids in 6 weeks.  Continue fenofibrate 145 mg daily.  I have advised her that I will continue to manage her lipids and not to make any changes without letting me know.  Discontinue aspirin as there is no indication.  Otherwise physical examination is unremarkable, blood pressure is normal.  Office visit in 2 months.    Yates Decamp, MD, [facility] 08/20/2022, 10:26 PM Office: [phone] Fax: [phone] Pager: [phone]

## 2022-08-20 NOTE — Patient Instructions (Signed)
Please go to [facility] in 6 weeks and have her labs done and I will see you back here in 8 weeks.

## 2022-09-03 DIAGNOSIS — E039 Hypothyroidism, unspecified: Secondary | ICD-10-CM | POA: Diagnosis not present

## 2022-09-03 DIAGNOSIS — E782 Mixed hyperlipidemia: Secondary | ICD-10-CM | POA: Diagnosis not present

## 2022-09-03 DIAGNOSIS — I10 Essential (primary) hypertension: Secondary | ICD-10-CM | POA: Diagnosis not present

## 2022-09-03 DIAGNOSIS — K219 Gastro-esophageal reflux disease without esophagitis: Secondary | ICD-10-CM | POA: Diagnosis not present

## 2022-09-03 DIAGNOSIS — M810 Age-related osteoporosis without current pathological fracture: Secondary | ICD-10-CM | POA: Diagnosis not present

## 2022-09-03 DIAGNOSIS — J302 Other seasonal allergic rhinitis: Secondary | ICD-10-CM | POA: Diagnosis not present

## 2022-09-04 ENCOUNTER — Ambulatory Visit: Payer: Medicare Other | Admitting: Obstetrics and Gynecology

## 2022-10-16 DIAGNOSIS — E559 Vitamin D deficiency, unspecified: Secondary | ICD-10-CM | POA: Diagnosis not present

## 2022-10-16 DIAGNOSIS — E782 Mixed hyperlipidemia: Secondary | ICD-10-CM | POA: Diagnosis not present

## 2022-10-17 LAB — LIPID PANEL WITH LDL/HDL RATIO
Cholesterol, Total: 175 mg/dL (ref 100–199)
HDL: 55 mg/dL (ref >39)
LDL Chol Calc (NIH): 68 mg/dL (ref 0–99)
LDL/HDL Ratio: 1.2 ratio (ref 0.0–3.2)
Triglycerides: 333 mg/dL — ABNORMAL HIGH (ref 0–149)
VLDL Cholesterol Cal: 52 mg/dL — ABNORMAL HIGH (ref 5–40)

## 2022-10-17 LAB — VITAMIN D 25 HYDROXY (VIT D DEFICIENCY, FRACTURES): Vit D, 25-Hydroxy: 53.3 ng/mL (ref 30.0–100.0)

## 2022-10-22 ENCOUNTER — Ambulatory Visit: Payer: Medicare Other | Admitting: Cardiology

## 2022-10-22 ENCOUNTER — Encounter: Payer: Self-pay | Admitting: Cardiology

## 2022-10-22 VITALS — BP 117/70 | HR 85 | Temp 98.9°F | Resp 16 | Ht <= 58 in | Wt 116.2 lb

## 2022-10-22 DIAGNOSIS — E782 Mixed hyperlipidemia: Secondary | ICD-10-CM | POA: Diagnosis not present

## 2022-10-22 DIAGNOSIS — E039 Hypothyroidism, unspecified: Secondary | ICD-10-CM | POA: Diagnosis not present

## 2022-10-22 DIAGNOSIS — M810 Age-related osteoporosis without current pathological fracture: Secondary | ICD-10-CM | POA: Diagnosis not present

## 2022-10-22 DIAGNOSIS — K219 Gastro-esophageal reflux disease without esophagitis: Secondary | ICD-10-CM | POA: Diagnosis not present

## 2022-10-22 DIAGNOSIS — E781 Pure hyperglyceridemia: Secondary | ICD-10-CM

## 2022-10-22 DIAGNOSIS — Z0001 Encounter for general adult medical examination with abnormal findings: Secondary | ICD-10-CM | POA: Diagnosis not present

## 2022-10-22 DIAGNOSIS — I10 Essential (primary) hypertension: Secondary | ICD-10-CM | POA: Diagnosis not present

## 2022-10-22 DIAGNOSIS — J302 Other seasonal allergic rhinitis: Secondary | ICD-10-CM | POA: Diagnosis not present

## 2022-10-22 MED ORDER — ATORVASTATIN CALCIUM 20 MG PO TABS: 20.0000 mg | ORAL_TABLET | Freq: Every evening | ORAL | 3 refills | Status: AC

## 2022-10-22 MED ORDER — AMLODIPINE BESYLATE 2.5 MG PO TABS: 2.5000 mg | ORAL_TABLET | Freq: Every day | ORAL | 3 refills | Status: DC

## 2022-10-22 NOTE — Addendum Note (Signed)
Addended by: Nori Riis on: 10/22/2022 08:58 PM   Modules accepted: Level of Service

## 2022-10-22 NOTE — Progress Notes (Signed)
Primary Physician/Referring:  Jackie Plum, MD  Patient ID: Christine Black, female    DOB: 1946-10-19, 76 y.o.   MRN: [id number]  Chief Complaint  Patient presents with   Hyperlipidemia   Follow-up    2 months   HPI:    Christine Black  is a 76 y.o. [place] origin female patient referred to me for evaluation of mixed hyperlipidemia.  Patient has diabetes mellitus, but no other cardiovascular risk factors, no hypertension or family history of premature coronary disease or tobacco use disorder. She walks daily without chest pain, dyspnea, claudication.  She is referred back to [place] for management of hyperlipidemia.  She presents today for 74-month follow-up for hyperlipidemia.  At last office visit Livalo was discontinued and she was started back atorvastatin 20 mg daily. She was otherwise stable form cardiovascular standpoint and no other changes were made. She presents today for 74 week follow-up She is here with her daughter that provides collateral history.  Past Medical History:  Diagnosis Date   Diabetes mellitus without complication (HCC)    Hyperlipidemia    Thyroid disease    Past Surgical History:  Procedure Laterality Date   ROTATOR CUFF REPAIR Bilateral    Family History  Problem Relation Age of Onset   Arthritis Mother    Heart attack Father 45   Arthritis Brother    Heart disease Brother    Lung disease Brother    Breast cancer Neg Hx     Social History   Tobacco Use   Smoking status: Never   Smokeless tobacco: Never  Substance Use Topics   Alcohol use: Not Currently   Marital Status: Widowed  ROS  Review of Systems  Cardiovascular:  Negative for chest pain, dyspnea on exertion and leg swelling.   Objective  Blood pressure 117/70, pulse 85, temperature 98.9 F (37.2 C), temperature source Temporal, resp. rate 16, height 4\' 7"  (1.397 m), weight 116 lb 3.2 oz (52.7 kg), SpO2 99 %. Body mass index is 27.01 kg/m.      10/22/2022    3:29 PM 08/20/2022     1:19 PM 07/08/2022    3:20 PM  Vitals with BMI  Height 4\' 7"  4\' 7"  4\' 7"   Weight 116 lbs 3 oz 119 lbs 117 lbs 13 oz  BMI 27.01 27.66 27.38  Systolic 117 122 07/10/2022  Diastolic 70 62 68  Pulse 85 78 75     Physical Exam Neck:     Vascular: No carotid bruit or JVD.  Cardiovascular:     Rate and Rhythm: Normal rate and regular rhythm.     Pulses: Intact distal pulses.     Heart sounds: Normal heart sounds. No murmur heard.    No gallop.  Pulmonary:     Effort: Pulmonary effort is normal.     Breath sounds: Normal breath sounds.  Abdominal:     General: Bowel sounds are normal.     Palpations: Abdomen is soft.  Musculoskeletal:     Right lower leg: No edema.     Left lower leg: No edema.     Laboratory examination:   Lipid Panel     Component Value Date/Time   CHOL 175 10/16/2022 1630   TRIG 333 (H) 10/16/2022 1630   HDL 55 10/16/2022 1630   LDLCALC 68 10/16/2022 1630   LDLDIRECT 161 (H) 06/05/2021 0810   LABVLDL 52 (H) 10/16/2022 1630     External labs    Labs 06/26/2022:  Total cholesterol 216, triglycerides  261, HDL 55, LDL 121.  Non-HDL cholesterol 161.  A1C 6.400 03/24/2022  Labs 08/20/2021:  Total cholesterol 152, triglycerides 110, HDL 57, LDL 75.  Non-HDL cholesterol 95. Allergies   Allergies  Allergen Reactions   Nsaids Other (See Comments)    Renal dysfunction    Final Medications at End of Visit     Current Outpatient Medications:    acetaminophen (TYLENOL) 500 MG tablet, Take 500 mg by mouth every 6 (six) hours as needed., Disp: , Rfl:    amLODipine (NORVASC) 2.5 MG tablet, Take 2.5 mg by mouth daily., Disp: , Rfl:    Apple Cider Vinegar 188 MG CAPS, Take by mouth., Disp: , Rfl:    atorvastatin (LIPITOR) 20 MG tablet, Take 1 tablet (20 mg total) by mouth at bedtime., Disp: 30 tablet, Rfl: 2   cetirizine (ZYRTEC) 10 MG tablet, Take 10 mg by mouth daily., Disp: , Rfl:    Cholecalciferol (VITAMIN D3) 1.25 MG (50000 UT) CAPS, Take 1 capsule by mouth  daily., Disp: , Rfl:    fenofibrate (TRICOR) 145 MG tablet, Take 145 mg by mouth daily., Disp: , Rfl:    fish oil-omega-3 fatty acids 1000 MG capsule, Take 2 g by mouth daily., Disp: , Rfl:    glipiZIDE (GLUCOTROL XL) 10 MG 24 hr tablet, Take 10 mg by mouth daily., Disp: , Rfl:    ibandronate (BONIVA) 150 MG tablet, Take 150 mg by mouth every 30 (thirty) days. Take in the morning with a full glass of water, on an empty stomach, and do not take anything else by mouth or lie down for the next 30 min., Disp: , Rfl:    JARDIANCE 10 MG TABS tablet, Take 10 mg by mouth daily., Disp: , Rfl:    levothyroxine (SYNTHROID) 75 MCG tablet, Take 75 mcg by mouth daily before breakfast., Disp: , Rfl:    montelukast (SINGULAIR) 10 MG tablet, Take 10 mg by mouth daily., Disp: , Rfl:    Multiple Vitamins-Minerals (CENTRUM SILVER ADULT 50+) TABS, Take 1 tablet by mouth daily., Disp: , Rfl:    Multiple Vitamins-Minerals (HAIR SKIN NAILS PO), Take 1 tablet by mouth daily., Disp: , Rfl:    pantoprazole (PROTONIX) 40 MG tablet, Take 40 mg by mouth daily., Disp: , Rfl:   Radiology:   No results found.  Cardiac Studies:   None  EKG:   EKG 06/02/2021: Normal sinus rhythm at rate of 82 bpm, normal axis, incomplete right bundle branch block.  No evidence of ischemia.    Assessment   No diagnosis found.   CV risk > 20%  Medications Discontinued During This Encounter  Medication Reason   losartan (COZAAR) 25 MG tablet      No orders of the defined types were placed in this encounter.  No orders of the defined types were placed in this encounter.  Recommendations:   Christine Black is a 76 y.o. [place] origin female patient referred to me for evaluation of mixed hyperlipidemia.  Patient has diabetes mellitus, but no other cardiovascular risk factors, no hypertension or family history of premature coronary disease or tobacco use disorder. She walks daily without chest pain, dyspnea, claudication.  She is  referred back to [place] for management of hyperlipidemia.  Mixed hyperlipidemia Hypertriglyceridemia At last visit she was restarted on atorvastatin 20 mg.  Reviewed recent lipid profile testing overall lipids are under excellent control.  Triglycerides mildly elevated but feel that this related to food intake prior to the lab as all  other components of lipid panel were under good control. We will repeat lipid profile testing in 6 months prior to next visit. Blood pressure is under excellent control we will continue amlodipine daily.  Discussed the importance of lifestyle modifications including diet and exercise.  Overall, she is doing well from a cardiovascular standpoint.  CV risk is >20% in view of diabetes, age, and risk factors, however she remains asymptomatic. I have a low threshold to pursue further ischemic evaluation.however shared decision with patient and her daughter was to continue to hold off on further ischemic evaluation at this time.  Follow-up in 6 months or sooner if needed.    Nori Riis, AGNP-C 10/22/2022, 3:59 PM Office: [phone] Fax: [phone] Pager: [phone]

## 2022-11-11 NOTE — Progress Notes (Signed)
Office Visit Note  Patient: Christine Black             Date of Birth: [birth year]           MRN: [id number]             PCP: Norm Salt, PA Referring: Bufford Buttner, MD Visit Date: 11/23/2022 Occupation: @GUAROCC @  Subjective:  New Patient (Initial Visit) (Right hand and wrist pain)   History of Present Illness: Christine Black is a 76 y.o. female seen in consultation per request of her PCP.  She is accompanied by her granddaughter today.  According to the patient about 3 years ago she started having pain in her right hand and right wrist.  She also has intermittent pain in her right hip joint.  She gives history of lower back pain for over 40 years.  She is retired now and worked in [employer] in the past.  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  None of the other joints are painful.  She has not noticed any joint swelling but she notices puffiness on her extremities.  She tried Aleve and meloxicam in the past without much relief.  She states the lower back pain is localized and does not have any radiculopathy.  She was recently diagnosed with osteoporosis.  According the patient she takes shots for osteoporosis.  She states her mother and her brother had severe crippling arthritis.  She is gravida 3, para 3.  Activities of Daily Living:  Patient reports morning stiffness for 0  none .   Patient Reports nocturnal pain.  Difficulty dressing/grooming: Denies Difficulty climbing stairs: Reports Difficulty getting out of chair: Denies Difficulty using hands for taps, buttons, cutlery, and/or writing: Denies  Review of Systems  Constitutional:  Negative for fatigue.  HENT:  Negative for mouth sores and mouth dryness.   Eyes:  Positive for dryness.  Respiratory:  Negative for shortness of breath.   Cardiovascular:  Negative for chest pain and palpitations.  Gastrointestinal:  Negative for blood in stool, constipation and diarrhea.  Endocrine:  Negative for increased urination.  Genitourinary:  Negative for involuntary urination.  Musculoskeletal:  Positive for joint pain, joint pain and joint swelling. Negative for gait problem, myalgias, muscle weakness, morning stiffness, muscle tenderness and myalgias.  Skin:  Negative for color change, rash, hair loss and sensitivity to sunlight.  Allergic/Immunologic: Negative for susceptible to infections.  Neurological:  Negative for dizziness and headaches.  Hematological:  Negative for swollen glands.  Psychiatric/Behavioral:  Positive for sleep disturbance. Negative for depressed mood. The patient is not nervous/anxious.     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) 11/23/2022   History of type 2 diabetes mellitus 11/23/2022   History of osteoporosis 11/23/2022    Past Medical History:  Diagnosis Date   Arthritis    Diabetes mellitus without complication (HCC)    Hyperlipidemia    Osteoporosis    Thyroid disease     Family History  Problem Relation Age of Onset   Arthritis Mother    Heart attack Father 61   Arthritis Brother    Heart disease Brother    Lung disease Brother    Breast cancer Neg Hx  Past Surgical History:  Procedure Laterality Date   FINGER AMPUTATION Left    Middle and ring finger tips   KNEE ARTHROSCOPY Bilateral    ROTATOR CUFF REPAIR Bilateral    SPINE SURGERY     Social History   Social History Narrative   Not on file    There is no immunization history on file for this patient.   Objective: Vital Signs: BP 117/71 (BP Location: Right Arm, Patient Position: Sitting, Cuff Size: Normal)   Pulse 74   Resp 16   Ht 4\' 7"  (1.397 m)   Wt 117 lb (53.1 kg)   BMI 27.19 kg/m    Physical Exam Vitals and nursing note  reviewed.  Constitutional:      Appearance: She is well-developed.  HENT:     Head: Normocephalic and atraumatic.  Eyes:     Conjunctiva/sclera: Conjunctivae normal.  Cardiovascular:     Rate and Rhythm: Normal rate and regular rhythm.     Heart sounds: Normal heart sounds.  Pulmonary:     Effort: Pulmonary effort is normal.     Breath sounds: Normal breath sounds.  Abdominal:     General: Bowel sounds are normal.     Palpations: Abdomen is soft.  Musculoskeletal:     Cervical back: Normal range of motion.  Lymphadenopathy:     Cervical: No cervical adenopathy.  Skin:    General: Skin is warm and dry.     Capillary Refill: Capillary refill takes less than 2 seconds.  Neurological:     Mental Status: She is alert and oriented to person, place, and time.  Psychiatric:        Behavior: Behavior normal.      Musculoskeletal Exam: Cervical spine, thoracic and lumbar spine were in good range of motion.  She had discomfort range of motion of the lumbar spine.  Shoulder joints, elbow joints, wrist joints, MCPs PIPs and DIPs were in full range of motion.  She had tenderness on palpation of the right wrist joint and right second MCP joint.  No synovitis was noted.  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  Hip joints and knee joints with good range of motion.  There was no tenderness over ankles or MTPs.  CDAI Exam: CDAI Score: -- Patient Global: --; Provider Global: -- Swollen: --; Tender: -- Joint Exam 11/23/2022   No joint exam has been documented for this visit   There is currently no information documented on the homunculus. Go to the Rheumatology activity and complete the homunculus joint exam.  Investigation: No additional findings.  Imaging: No results found.  Recent Labs: No results found for: "WBC", "HGB", "PLT", "NA", "K", "CL", "CO2", "GLUCOSE", "BUN", "CREATININE", "BILITOT", "ALKPHOS", "AST", "ALT", "PROT", "ALBUMIN", "CALCIUM",  "GFRAA", "QFTBGOLD", "QFTBGOLDPLUS"  Speciality Comments: No specialty comments available.  Procedures:  No procedures performed Allergies: Nsaids   Assessment / Plan:     Visit Diagnoses: Pain in right hand -complains of pain and discomfort in her right hand and right wrist joint for the last 3 years.  She states she has not noticed any swelling but she feels puffiness in her wrist joint.  No synovitis was noted.  She had tenderness on palpation of right second MCP and right wrist joint.  Plan: XR Hand 2 View Right.  X-rays of the right hand showed osteoarthritic changes and generalized osteopenia.  A handout on hand exercises was given.  Pain in right hip-she gives history of intermittent  discomfort in the right hip joint.  She had good range of motion of her right hip joint.  She has mild tenderness over right trochanteric bursa consistent with trochanteric bursitis.  IT band stretching exercises were demonstrated in the office today.  Chronic midline low back pain without sciatica -she complains of lower back pain for over 40 years.  She had no point tenderness.  She denies any radiculopathy.  Plan: XR Lumbar Spine 2-3 Views.  X-rays showed multilevel spondylosis with severe narrowing between the L3-L4 and facet joint arthropathy.  A handout on lumbar spine exercises was given.  History of osteoporosis -patient states that she was diagnosed with osteoporosis this year.  Patient is on Boniva 150 mg p.o. monthly.  Use of calcium rich diet and vitamin D was emphasized.  Need for regular exercises were emphasized.  Patient walks twice a day and also has been doing stretching exercises.  Vitamin D deficiency-she has history of vitamin D deficiency.  She takes vitamin D.  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  Will check CBC and CMP today.  Essential hypertension-blood pressure was  normal today.  She is on amlodipine 2.5 mg p.o. daily.  History of hyperlipidemia-she is currently on Lipitor and is followed by Dr. Jacinto Halim.  History of gastroesophageal reflux (GERD)-she takes Protonix for reflux.  History of hypothyroidism  Orders: Orders Placed This Encounter  Procedures   XR Hand 2 View Right   XR Lumbar Spine 2-3 Views   CBC with Differential/Platelet   COMPLETE METABOLIC PANEL WITH GFR   Sedimentation rate   Uric acid   Rheumatoid factor   Cyclic citrul peptide antibody, IgG   ANA   No orders of the defined types were placed in this encounter.    Follow-Up Instructions: No follow-ups on file.   Pollyann Savoy, MD  Note - This record has been created using Animal nutritionist.  Chart creation errors have been sought, but may not always  have been located. Such creation errors do not reflect on  the standard of medical care.

## 2022-11-23 ENCOUNTER — Ambulatory Visit (INDEPENDENT_AMBULATORY_CARE_PROVIDER_SITE_OTHER): Payer: Medicare Other

## 2022-11-23 ENCOUNTER — Ambulatory Visit: Payer: Medicare Other | Attending: Rheumatology | Admitting: Rheumatology

## 2022-11-23 ENCOUNTER — Encounter: Payer: Self-pay | Admitting: Rheumatology

## 2022-11-23 VITALS — BP 117/71 | HR 74 | Resp 16 | Ht <= 58 in | Wt 117.0 lb

## 2022-11-23 DIAGNOSIS — Z8739 Personal history of other diseases of the musculoskeletal system and connective tissue: Secondary | ICD-10-CM | POA: Diagnosis not present

## 2022-11-23 DIAGNOSIS — Z8719 Personal history of other diseases of the digestive system: Secondary | ICD-10-CM

## 2022-11-23 DIAGNOSIS — M2550 Pain in unspecified joint: Secondary | ICD-10-CM

## 2022-11-23 DIAGNOSIS — M79641 Pain in right hand: Secondary | ICD-10-CM | POA: Diagnosis not present

## 2022-11-23 DIAGNOSIS — N1832 Chronic kidney disease, stage 3b: Secondary | ICD-10-CM | POA: Diagnosis not present

## 2022-11-23 DIAGNOSIS — M25551 Pain in right hip: Secondary | ICD-10-CM | POA: Diagnosis not present

## 2022-11-23 DIAGNOSIS — I10 Essential (primary) hypertension: Secondary | ICD-10-CM | POA: Diagnosis not present

## 2022-11-23 DIAGNOSIS — E559 Vitamin D deficiency, unspecified: Secondary | ICD-10-CM | POA: Diagnosis not present

## 2022-11-23 DIAGNOSIS — G8929 Other chronic pain: Secondary | ICD-10-CM

## 2022-11-23 DIAGNOSIS — M545 Low back pain, unspecified: Secondary | ICD-10-CM

## 2022-11-23 DIAGNOSIS — R5383 Other fatigue: Secondary | ICD-10-CM | POA: Diagnosis not present

## 2022-11-23 DIAGNOSIS — Z8639 Personal history of other endocrine, nutritional and metabolic disease: Secondary | ICD-10-CM | POA: Insufficient documentation

## 2022-11-24 DIAGNOSIS — I10 Essential (primary) hypertension: Secondary | ICD-10-CM | POA: Diagnosis not present

## 2022-11-24 DIAGNOSIS — E782 Mixed hyperlipidemia: Secondary | ICD-10-CM | POA: Diagnosis not present

## 2022-11-24 DIAGNOSIS — M810 Age-related osteoporosis without current pathological fracture: Secondary | ICD-10-CM | POA: Diagnosis not present

## 2022-11-24 DIAGNOSIS — J302 Other seasonal allergic rhinitis: Secondary | ICD-10-CM | POA: Diagnosis not present

## 2022-11-24 DIAGNOSIS — E1165 Type 2 diabetes mellitus with hyperglycemia: Secondary | ICD-10-CM | POA: Diagnosis not present

## 2022-11-24 DIAGNOSIS — K219 Gastro-esophageal reflux disease without esophagitis: Secondary | ICD-10-CM | POA: Diagnosis not present

## 2022-11-24 DIAGNOSIS — E039 Hypothyroidism, unspecified: Secondary | ICD-10-CM | POA: Diagnosis not present

## 2022-11-24 NOTE — Progress Notes (Signed)
CBC normal, CMP shows elevated glucose and elevated creatinine.  Please notify patient and forward results to PCP.  Sed rate is normal, uric acid normal, rheumatoid factor negative.  ANA and anti-CCP results are pending.

## 2022-11-25 LAB — COMPLETE METABOLIC PANEL WITH GFR
AG Ratio: 1.8 (calc) (ref 1.0–2.5)
ALT: 19 U/L (ref 6–29)
AST: 26 U/L (ref 10–35)
Albumin: 5.1 g/dL (ref 3.6–5.1)
Alkaline phosphatase (APISO): 51 U/L (ref 37–153)
BUN/Creatinine Ratio: 17 (calc) (ref 6–22)
BUN: 20 mg/dL (ref 7–25)
CO2: 28 mmol/L (ref 20–32)
Calcium: 10.2 mg/dL (ref 8.6–10.4)
Chloride: 101 mmol/L (ref 98–110)
Creat: 1.18 mg/dL — ABNORMAL HIGH (ref 0.60–1.00)
Globulin: 2.8 g/dL (calc) (ref 1.9–3.7)
Glucose, Bld: 185 mg/dL — ABNORMAL HIGH (ref 65–99)
Potassium: 3.8 mmol/L (ref 3.5–5.3)
Sodium: 137 mmol/L (ref 135–146)
Total Bilirubin: 0.4 mg/dL (ref 0.2–1.2)
Total Protein: 7.9 g/dL (ref 6.1–8.1)
eGFR: 48 mL/min/{1.73_m2} — ABNORMAL LOW (ref >=60)

## 2022-11-25 LAB — CBC WITH DIFFERENTIAL/PLATELET
Absolute Monocytes: 429 cells/uL (ref 200–950)
Basophils Absolute: 59 cells/uL (ref 0–200)
Basophils Relative: 0.9 %
Eosinophils Absolute: 92 cells/uL (ref 15–500)
Eosinophils Relative: 1.4 %
HCT: 42 % (ref 35.0–45.0)
Hemoglobin: 14.1 g/dL (ref 11.7–15.5)
Lymphs Abs: 2257 cells/uL (ref 850–3900)
MCH: 33.4 pg — ABNORMAL HIGH (ref 27.0–33.0)
MCHC: 33.6 g/dL (ref 32.0–36.0)
MCV: 99.5 fL (ref 80.0–100.0)
MPV: 10.8 fL (ref 7.5–12.5)
Monocytes Relative: 6.5 %
Neutro Abs: 3762 cells/uL (ref 1500–7800)
Neutrophils Relative %: 57 %
Platelets: 370 10*3/uL (ref 140–400)
RBC: 4.22 10*6/uL (ref 3.80–5.10)
RDW: 11.8 % (ref 11.0–15.0)
Total Lymphocyte: 34.2 %
WBC: 6.6 10*3/uL (ref 3.8–10.8)

## 2022-11-25 LAB — SEDIMENTATION RATE: Sed Rate: 9 mm/h (ref 0–30)

## 2022-11-25 LAB — RHEUMATOID FACTOR: Rheumatoid fact SerPl-aCnc: <14 IU/mL (ref <14)

## 2022-11-25 LAB — URIC ACID: Uric Acid, Serum: 3.7 mg/dL (ref 2.5–7.0)

## 2022-11-25 LAB — CYCLIC CITRUL PEPTIDE ANTIBODY, IGG: Cyclic Citrullin Peptide Ab: <16 UNITS

## 2022-11-25 LAB — ANA: Anti Nuclear Antibody (ANA): NEGATIVE

## 2022-11-25 NOTE — Progress Notes (Signed)
ANA negative, anti-CCP negative.

## 2022-12-14 NOTE — Progress Notes (Unsigned)
Office Visit Note  Patient: Christine Black             Date of Birth: 10/01/1946           MRN: [id number]             PCP: Trey Sailors, PA Referring: Benito Mccreedy, MD Visit Date: 12/17/2022 Occupation: _0 @  Subjective:  Patient in right hand and lower back  History of Present Illness: Christine Black is a 77 y.o. female with history of osteoarthritis.  She states she continues to have pain and discomfort in her right hand and right wrist.  She also continues to have lower back pain.  She states the pain is localized and does not radiate into her lower extremities.  She has off-and-on discomfort in the right trochanteric bursa.  She denies any joint swelling.  She notices puffiness on her right forearm at times.    Activities of Daily Living:  Patient reports morning stiffness for 0 minutes.   Patient Reports nocturnal pain.  Difficulty dressing/grooming: Reports Difficulty climbing stairs: Reports Difficulty getting out of chair: Denies Difficulty using hands for taps, buttons, cutlery, and/or writing: Denies  Review of Systems  Constitutional:  Negative for fatigue.  HENT:  Positive for mouth dryness. Negative for mouth sores.   Eyes:  Negative for dryness.  Respiratory:  Negative for shortness of breath.   Cardiovascular:  Negative for chest pain and palpitations.  Gastrointestinal:  Negative for blood in stool, constipation and diarrhea.  Endocrine: Negative for increased urination.  Genitourinary:  Negative for involuntary urination.  Musculoskeletal:  Positive for joint pain, joint pain and joint swelling. Negative for gait problem, myalgias, muscle weakness, morning stiffness, muscle tenderness and myalgias.  Skin:  Negative for color change, rash, hair loss and sensitivity to sunlight.  Allergic/Immunologic: Negative for susceptible to infections.  Neurological:  Negative for dizziness and headaches.  Hematological:  Negative for swollen glands.   Psychiatric/Behavioral:  Positive for sleep disturbance. Negative for depressed mood. The patient is not nervous/anxious.     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 ([place]) 11/23/2022   History of type 2 diabetes mellitus 11/23/2022   History of osteoporosis 11/23/2022    Past Medical History:  Diagnosis Date   Arthritis    Diabetes mellitus without complication ([place])    Hyperlipidemia    Osteoporosis    Thyroid disease     Family History  Problem Relation Age of Onset   Arthritis Mother    Heart attack Father 39   Arthritis Brother    Heart disease Brother    Lung disease Brother    Breast cancer Neg Hx    Past Surgical History:  Procedure Laterality Date   FINGER AMPUTATION Left    Middle and ring finger tips   KNEE ARTHROSCOPY Bilateral    ROTATOR CUFF REPAIR Bilateral    SPINE SURGERY     Social History   Social History Narrative   Not on file    There is no immunization history on file for this patient.   Objective: Vital Signs: BP 125/76 (BP Location: Left Arm, Patient Position: Sitting, Cuff Size: Normal)   Pulse 71   Resp 13   Ht _1  (1.397 m)   Wt 121 lb (54.9 kg)  BMI 28.12 kg/m    Physical Exam Vitals and nursing note reviewed.  Constitutional:      Appearance: She is well-developed.  HENT:     Head: Normocephalic and atraumatic.  Eyes:     Conjunctiva/sclera: Conjunctivae normal.  Cardiovascular:     Rate and Rhythm: Normal rate and regular rhythm.     Heart sounds: Normal heart sounds.  Pulmonary:     Effort: Pulmonary effort is normal.     Breath sounds: Normal breath sounds.  Abdominal:     General: Bowel sounds are normal.     Palpations: Abdomen is soft.  Musculoskeletal:     Cervical back: Normal range  of motion.  Lymphadenopathy:     Cervical: No cervical adenopathy.  Skin:    General: Skin is warm and dry.     Capillary Refill: Capillary refill takes less than 2 seconds.  Neurological:     Mental Status: She is alert and oriented to person, place, and time.  Psychiatric:        Behavior: Behavior normal.      Musculoskeletal Exam: Cervical, thoracic and lumbar spine were in good range of motion.  She had discomfort in the lower lumbar region.  Shoulder joints, elbow joints, wrist joints, MCPs PIPs and DIPs been good range of motion.  She had bilateral PIP and DIP thickening.  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  No synovitis was noted.  Hip joints and knee joints were in good range of motion.  She has mild tenderness over right trochanteric bursa.  There was no tenderness over ankles or MTPs.  CDAI Exam: CDAI Score: -- Patient Global: --; Provider Global: -- Swollen: --; Tender: -- Joint Exam 12/17/2022   No joint exam has been documented for this visit   There is currently no information documented on the homunculus. Go to the Rheumatology activity and complete the homunculus joint exam.  Investigation: No additional findings.  Imaging: XR Lumbar Spine 2-3 Views  Result Date: 11/23/2022 Multilevel spondylosis with significant narrowing between L1-L2, L3-L4 was noted.  Facet joint arthropathy with foraminal narrowing was noted.  No SI joint changes were noted. 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  XR Hand 2 View Right  Result Date: 11/23/2022 PIP and DIP narrowing was noted.  No MCP, intercarpal or radiocarpal joint space narrowing was noted.  No erosive changes were noted.  Generalized osteopenia was noted. Impression: These findings are consistent with osteoarthritis of the hand.   Recent Labs: Lab Results  Component Value Date   WBC 6.6 11/23/2022   HGB 14.1  11/23/2022   PLT 370 11/23/2022   NA 137 11/23/2022   K 3.8 11/23/2022   CL 101 11/23/2022   CO2 28 11/23/2022   GLUCOSE 185 (H) 11/23/2022   BUN 20 11/23/2022   CREATININE 1.18 (H) 11/23/2022   BILITOT 0.4 11/23/2022   AST 26 11/23/2022   ALT 19 11/23/2022   PROT 7.9 11/23/2022   CALCIUM 10.2 11/23/2022     Speciality Comments: No specialty comments available.  Procedures:  No procedures performed Allergies: Nsaids   Assessment / Plan:     Visit Diagnoses: Pain in right hand -she complains of discomfort over the right first MCP joint and her wrist joints.  No synovitis was noted.  She had bilateral PIP and DIP thickening.  She had no tenderness on palpation today.November 23, 2022 ESR 9, uric acid  3.7, RF negative, anti-CCP negative, ANA negative.  Labs obtained at the last visit were reviewed which were within normal limits.  X-rays showed osteoarthritic changes and generalized osteopenia.  Detailed counseling on osteoarthritis was provided.  Joint protection muscle strengthening was discussed.  A handout on hand exercises was given.  Trochanteric bursitis, right hip - Tenderness over right trochanteric bursa was noted.  History of intermittent right hip pain.  IT band stretches were discussed.  DDD (degenerative disc disease), lumbar - History of low back pain for over 40 years.  X-rays showed multilevel spondylosis with severe narrowing between L3-L4.  Facet joint arthropathy noted.  X-rays were reviewed with the patient and her daughter.  A handout on back exercises was given.  I offered physical therapy which she declined.  Advised her to contact me if the lower back pain persists.  History of osteoporosis - She is on Boniva 150 mg p.o. monthly.  Use of calcium rich to add in vitamin D was advised.  Regular exercise was advised.  Vitamin D deficiency - She takes vitamin D supplement.  Other medical problems are listed as follows:  History of type 2 diabetes  mellitus  Essential hypertension-blood pressure was 125/76 today.  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  History of hyperlipidemia - She is on Lipitor by Dr. Einar Gip.  Orders: No orders of the defined types were placed in this encounter.  No orders of the defined types were placed in this encounter.    Follow-Up Instructions: Return if symptoms worsen or fail to improve, for Osteoarthritis.   Bo Merino, MD  Note - This record has been created using Editor, commissioning.  Chart creation errors have been sought, but may not always  have been located. Such creation errors do not reflect on  the standard of medical care.

## 2022-12-17 ENCOUNTER — Encounter: Payer: Self-pay | Admitting: Rheumatology

## 2022-12-17 ENCOUNTER — Ambulatory Visit: Payer: Medicare Other | Attending: Rheumatology | Admitting: Rheumatology

## 2022-12-17 VITALS — BP 125/76 | HR 71 | Resp 13 | Ht <= 58 in | Wt 121.0 lb

## 2022-12-17 DIAGNOSIS — Z8639 Personal history of other endocrine, nutritional and metabolic disease: Secondary | ICD-10-CM

## 2022-12-17 DIAGNOSIS — E559 Vitamin D deficiency, unspecified: Secondary | ICD-10-CM

## 2022-12-17 DIAGNOSIS — N1832 Chronic kidney disease, stage 3b: Secondary | ICD-10-CM

## 2022-12-17 DIAGNOSIS — M5136 Other intervertebral disc degeneration, lumbar region: Secondary | ICD-10-CM | POA: Diagnosis not present

## 2022-12-17 DIAGNOSIS — Z8739 Personal history of other diseases of the musculoskeletal system and connective tissue: Secondary | ICD-10-CM

## 2022-12-17 DIAGNOSIS — M79641 Pain in right hand: Secondary | ICD-10-CM

## 2022-12-17 DIAGNOSIS — Z8719 Personal history of other diseases of the digestive system: Secondary | ICD-10-CM

## 2022-12-17 DIAGNOSIS — M7061 Trochanteric bursitis, right hip: Secondary | ICD-10-CM

## 2022-12-17 DIAGNOSIS — I10 Essential (primary) hypertension: Secondary | ICD-10-CM

## 2022-12-17 DIAGNOSIS — M51369 Other intervertebral disc degeneration, lumbar region without mention of lumbar back pain or lower extremity pain: Secondary | ICD-10-CM

## 2022-12-17 NOTE — Patient Instructions (Signed)
Hand Exercises Hand exercises can be helpful for almost anyone. These exercises can strengthen the hands, improve flexibility and movement, and increase blood flow to the hands. These results can make work and daily tasks easier. Hand exercises can be especially helpful for people who have joint pain from arthritis or have nerve damage from overuse (carpal tunnel syndrome). These exercises can also help people who have injured a hand. Exercises Most of these hand exercises are gentle stretching and motion exercises. It is usually safe to do them often throughout the day. Warming up your hands before exercise may help to reduce stiffness. You can do this with gentle massage or by placing your hands in warm water for 10-15 minutes. It is normal to feel some stretching, pulling, tightness, or mild discomfort as you begin new exercises. This will gradually improve. Stop an exercise right away if you feel sudden, severe pain or your pain gets worse. Ask your health care provider which exercises are best for you. Knuckle bend or "claw" fist  Stand or sit with your arm, hand, and all five fingers pointed straight up. Make sure to keep your wrist straight during the exercise. Gently bend your fingers down toward your palm until the tips of your fingers are touching the top of your palm. Keep your big knuckle straight and just bend the small knuckles in your fingers. Hold this position for __________ seconds. Straighten (extend) your fingers back to the starting position. Repeat this exercise 5-10 times with each hand. Full finger fist  Stand or sit with your arm, hand, and all five fingers pointed straight up. Make sure to keep your wrist straight during the exercise. Gently bend your fingers into your palm until the tips of your fingers are touching the middle of your palm. Hold this position for __________ seconds. Extend your fingers back to the starting position, stretching every joint fully. Repeat  this exercise 5-10 times with each hand. Straight fist Stand or sit with your arm, hand, and all five fingers pointed straight up. Make sure to keep your wrist straight during the exercise. Gently bend your fingers at the big knuckle, where your fingers meet your hand, and the middle knuckle. Keep the knuckle at the tips of your fingers straight and try to touch the bottom of your palm. Hold this position for __________ seconds. Extend your fingers back to the starting position, stretching every joint fully. Repeat this exercise 5-10 times with each hand. Tabletop  Stand or sit with your arm, hand, and all five fingers pointed straight up. Make sure to keep your wrist straight during the exercise. Gently bend your fingers at the big knuckle, where your fingers meet your hand, as far down as you can while keeping the small knuckles in your fingers straight. Think of forming a tabletop with your fingers. Hold this position for __________ seconds. Extend your fingers back to the starting position, stretching every joint fully. Repeat this exercise 5-10 times with each hand. Finger spread  Place your hand flat on a table with your palm facing down. Make sure your wrist stays straight as you do this exercise. Spread your fingers and thumb apart from each other as far as you can until you feel a gentle stretch. Hold this position for __________ seconds. Bring your fingers and thumb tight together again. Hold this position for __________ seconds. Repeat this exercise 5-10 times with each hand. Making circles  Stand or sit with your arm, hand, and all five fingers pointed   straight up. Make sure to keep your wrist straight during the exercise. Make a circle by touching the tip of your thumb to the tip of your index finger. Hold for __________ seconds. Then open your hand wide. Repeat this motion with your thumb and each finger on your hand. Repeat this exercise 5-10 times with each hand. Thumb  motion  Sit with your forearm resting on a table and your wrist straight. Your thumb should be facing up toward the ceiling. Keep your fingers relaxed as you move your thumb. Lift your thumb up as high as you can toward the ceiling. Hold for __________ seconds. Bend your thumb across your palm as far as you can, reaching the tip of your thumb for the small finger (pinkie) side of your palm. Hold for __________ seconds. Repeat this exercise 5-10 times with each hand. Grip strengthening  Hold a stress ball or other soft ball in the middle of your hand. Slowly increase the pressure, squeezing the ball as much as you can without causing pain. Think of bringing the tips of your fingers into the middle of your palm. All of your finger joints should bend when doing this exercise. Hold your squeeze for __________ seconds, then relax. Repeat this exercise 5-10 times with each hand. Contact a health care provider if: Your hand pain or discomfort gets much worse when you do an exercise. Your hand pain or discomfort does not improve within 2 hours after you exercise. If you have any of these problems, stop doing these exercises right away. Do not do them again unless your health care provider says that you can. Get help right away if: You develop sudden, severe hand pain or swelling. If this happens, stop doing these exercises right away. Do not do them again unless your health care provider says that you can. This information is not intended to replace advice given to you by your health care provider. Make sure you discuss any questions you have with your health care provider. Document Revised: 03/20/2021 Document Reviewed: 03/20/2021 Elsevier Patient Education  2023 [employer]  Back Exercises The following exercises strengthen the muscles that help to support the trunk (torso) and back. They also help to keep the lower back flexible. Doing these exercises can help to prevent or lessen existing low  back pain. If you have back pain or discomfort, try doing these exercises 2-3 times each day or as told by your health care provider. As your pain improves, do them once each day, but increase the number of times that you repeat the steps for each exercise (do more repetitions). To prevent the recurrence of back pain, continue to do these exercises once each day or as told by your health care provider. Do exercises exactly as told by your health care provider and adjust them as directed. It is normal to feel mild stretching, pulling, tightness, or discomfort as you do these exercises, but you should stop right away if you feel sudden pain or your pain gets worse. Exercises Single knee to chest Repeat these steps 3-5 times for each leg: Lie on your back on a firm bed or the floor with your legs extended. Bring one knee to your chest. Your other leg should stay extended and in contact with the floor. Hold your knee in place by grabbing your knee or thigh with both hands and hold. Pull on your knee until you feel a gentle stretch in your lower back or buttocks. Hold the stretch for 10-30   seconds. Slowly release and straighten your leg.  Pelvic tilt Repeat these steps 5-10 times: Lie on your back on a firm bed or the floor with your legs extended. Bend your knees so they are pointing toward the ceiling and your feet are flat on the floor. Tighten your lower abdominal muscles to press your lower back against the floor. This motion will tilt your pelvis so your tailbone points up toward the ceiling instead of pointing to your feet or the floor. With gentle tension and even breathing, hold this position for 5-10 seconds.  Cat-cow Repeat these steps until your lower back becomes more flexible: Get into a hands-and-knees position on a firm bed or the floor. Keep your hands under your shoulders, and keep your knees under your hips. You may place padding under your knees for comfort. Let your head hang  down toward your chest. Contract your abdominal muscles and point your tailbone toward the floor so your lower back becomes rounded like the back of a cat. Hold this position for 5 seconds. Slowly lift your head, let your abdominal muscles relax, and point your tailbone up toward the ceiling so your back forms a sagging arch like the back of a cow. Hold this position for 5 seconds.  Press-ups Repeat these steps 5-10 times: Lie on your abdomen (face-down) on a firm bed or the floor. Place your palms near your head, about shoulder-width apart. Keeping your back as relaxed as possible and keeping your hips on the floor, slowly straighten your arms to raise the top half of your body and lift your shoulders. Do not use your back muscles to raise your upper torso. You may adjust the placement of your hands to make yourself more comfortable. Hold this position for 5 seconds while you keep your back relaxed. Slowly return to lying flat on the floor.  Bridges Repeat these steps 10 times: Lie on your back on a firm bed or the floor. Bend your knees so they are pointing toward the ceiling and your feet are flat on the floor. Your arms should be flat at your sides, next to your body. Tighten your buttocks muscles and lift your buttocks off the floor until your waist is at almost the same height as your knees. You should feel the muscles working in your buttocks and the back of your thighs. If you do not feel these muscles, slide your feet 1-2 inches (2.5-5 cm) farther away from your buttocks. Hold this position for 3-5 seconds. Slowly lower your hips to the starting position, and allow your buttocks muscles to relax completely. If this exercise is too easy, try doing it with your arms crossed over your chest. Abdominal crunches Repeat these steps 5-10 times: Lie on your back on a firm bed or the floor with your legs extended. Bend your knees so they are pointing toward the ceiling and your feet are flat  on the floor. Cross your arms over your chest. Tip your chin slightly toward your chest without bending your neck. Tighten your abdominal muscles and slowly raise your torso high enough to lift your shoulder blades a tiny bit off the floor. Avoid raising your torso higher than that because it can put too much stress on your lower back and does not help to strengthen your abdominal muscles. Slowly return to your starting position.  Back lifts Repeat these steps 5-10 times: Lie on your abdomen (face-down) with your arms at your sides, and rest your forehead on the floor.   Tighten the muscles in your legs and your buttocks. Slowly lift your chest off the floor while you keep your hips pressed to the floor. Keep the back of your head in line with the curve in your back. Your eyes should be looking at the floor. Hold this position for 3-5 seconds. Slowly return to your starting position.  Contact a health care provider if: Your back pain or discomfort gets much worse when you do an exercise. Your worsening back pain or discomfort does not lessen within 2 hours after you exercise. If you have any of these problems, stop doing these exercises right away. Do not do them again unless your health care provider says that you can. Get help right away if: You develop sudden, severe back pain. If this happens, stop doing the exercises right away. Do not do them again unless your health care provider says that you can. This information is not intended to replace advice given to you by your health care provider. Make sure you discuss any questions you have with your health care provider. Document Revised: 05/27/2021 Document Reviewed: 02/12/2021 Elsevier Patient Education  2023 [employer]  

## 2022-12-20 IMAGING — US US RENAL
1 series · 14 of 25 positions shown · non-contrast
Comparison: None Available.

CLINICAL DATA: Chronic kidney disease stage 3 B

EXAM:
RENAL / URINARY TRACT ULTRASOUND COMPLETE

[Series 1: us renal · 0.20mm/px · 14 of 42 slices shown]
[im 1/42]
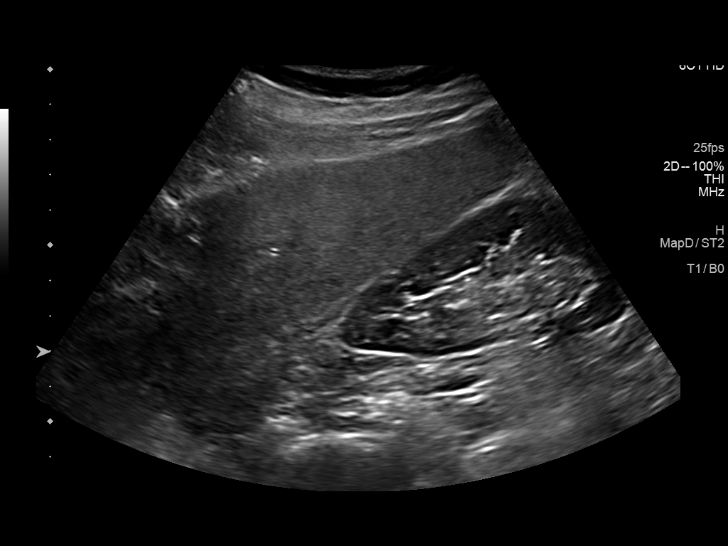
[im 4/42]
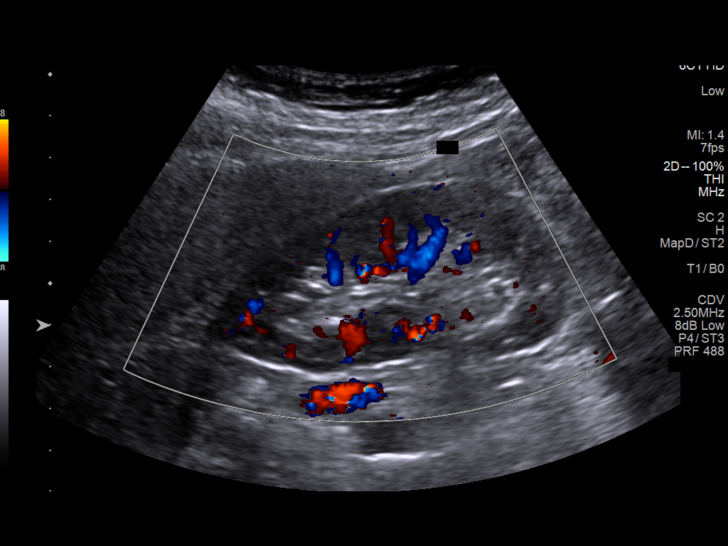
[im 7/42]
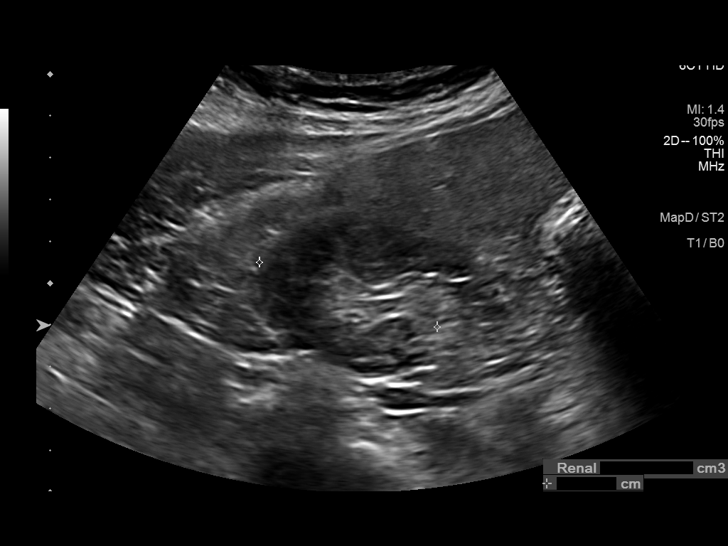
[im 11/42]
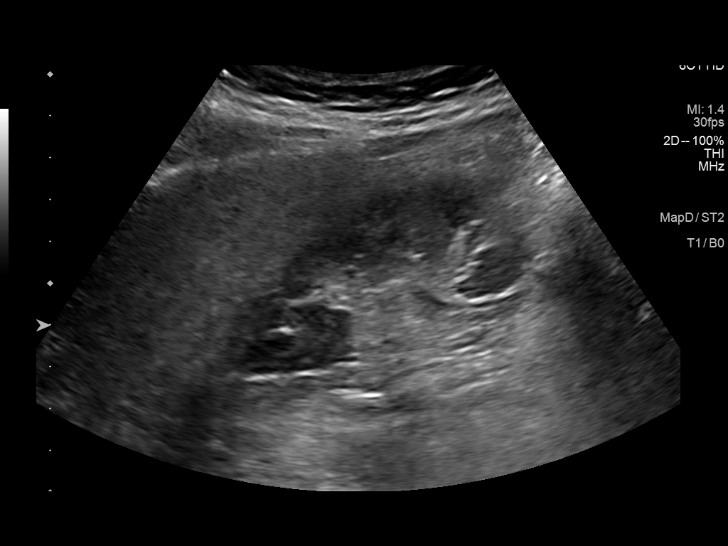
[im 14/42]
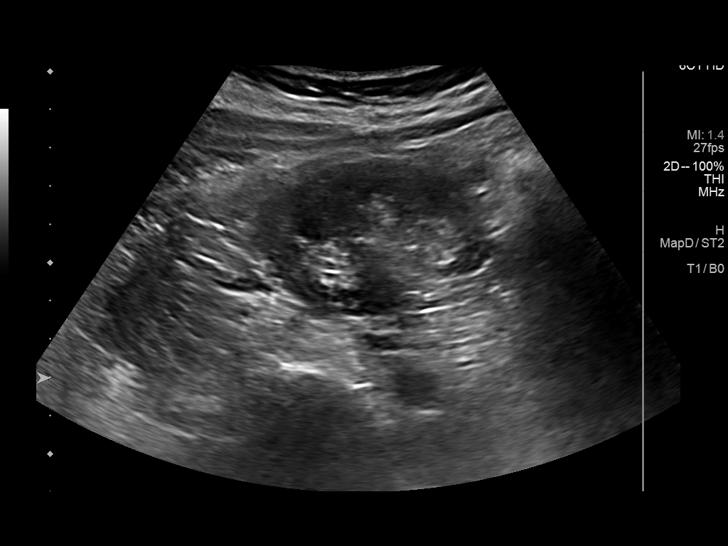
[im 16/42]
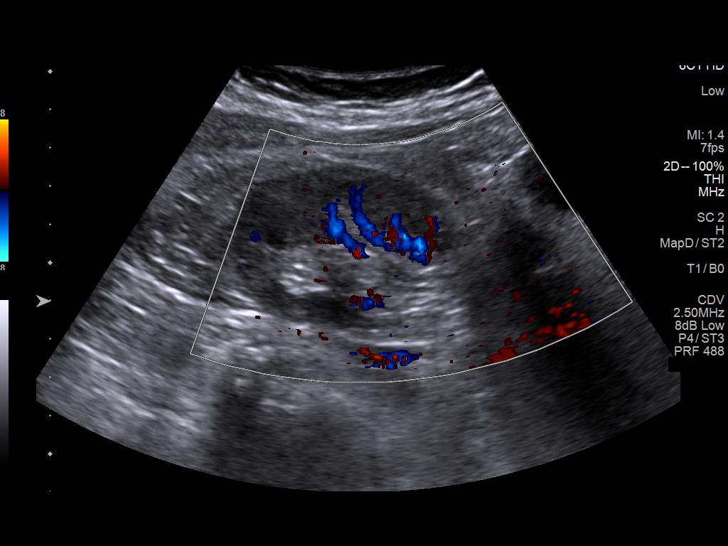
[im 19/42]
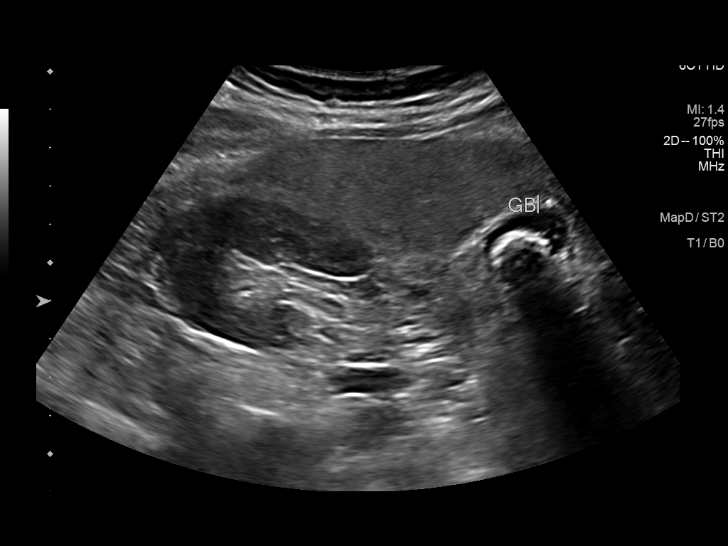
[im 23/42]
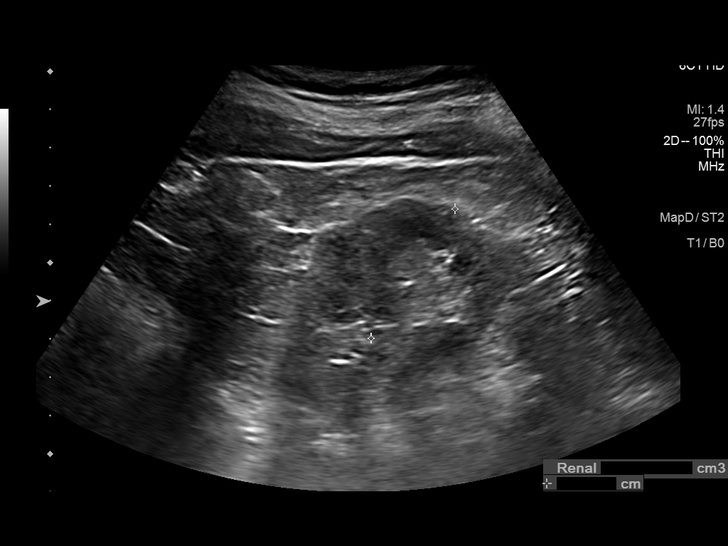
[im 26/42]
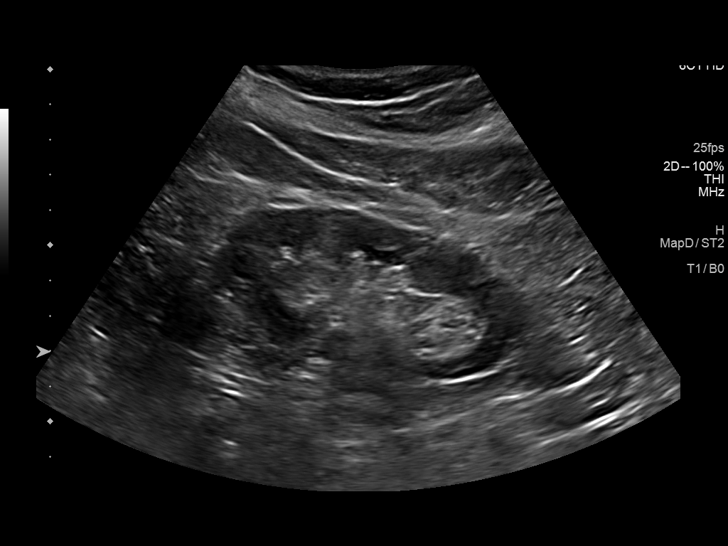
[im 28/42]
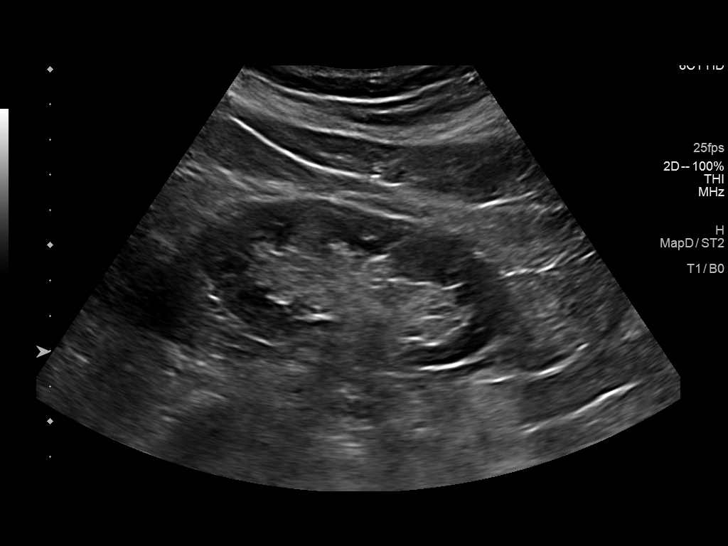
[im 31/42]
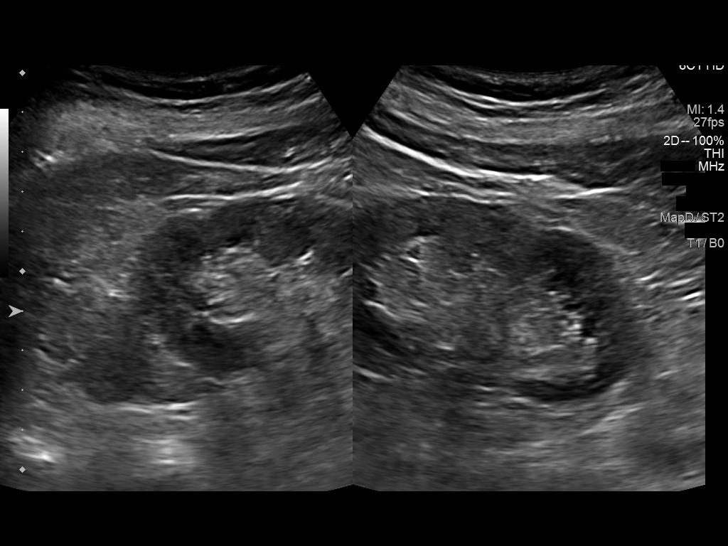
[im 35/42]
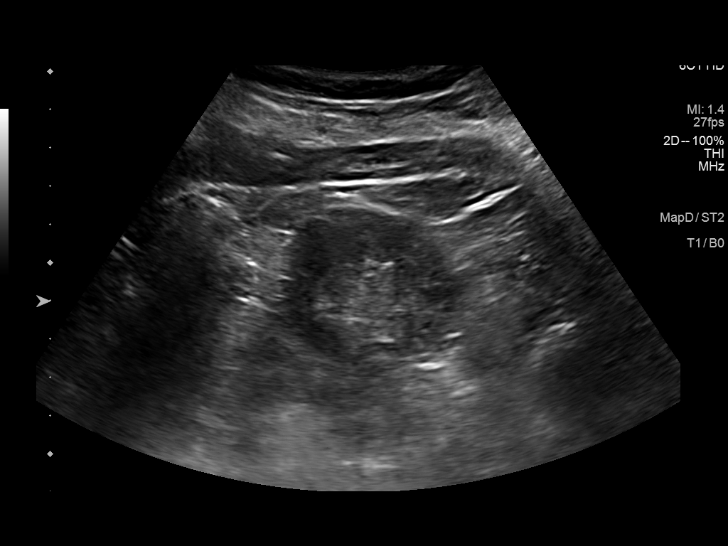
[im 38/42]
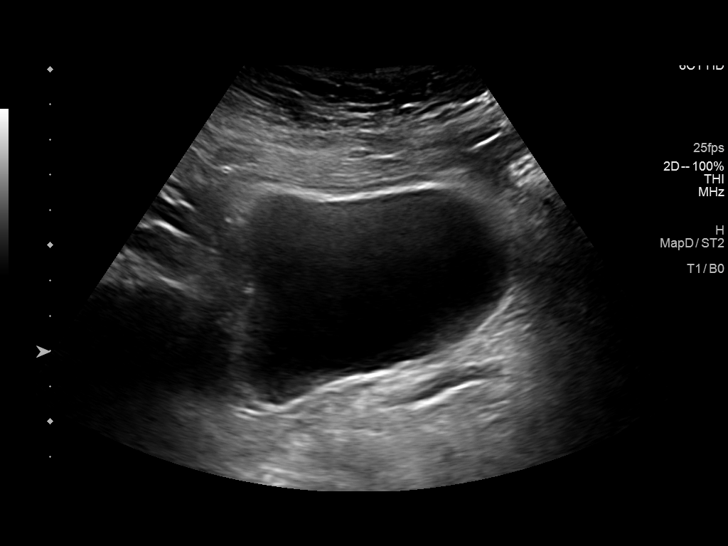
[im 42/42]
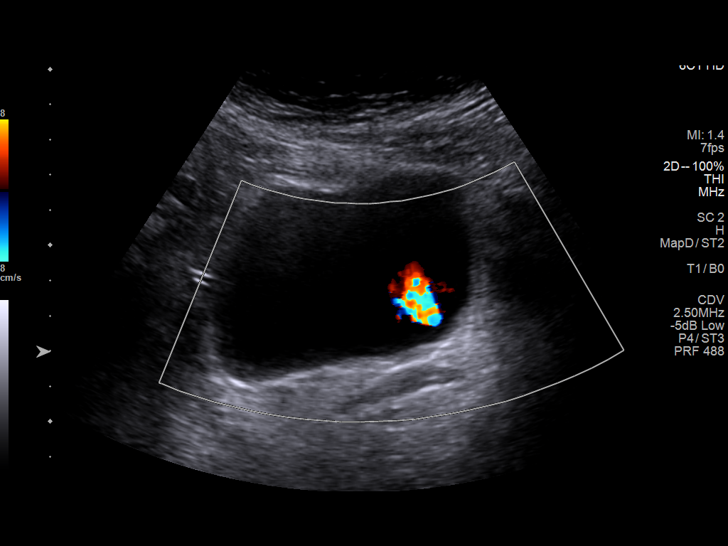

[14 of 25 positions shown; findings below may reference images not displayed]

FINDINGS: Right Kidney:

Renal measurements: 8.6 x 4.1 x 4.5 cm = volume: 83 mL. Echogenicity
within normal limits. No mass or hydronephrosis visualized.

Left Kidney:

Renal measurements: 8.7 x 4.8 x 4 cm = volume: 88 mL. Echogenicity
within normal limits. No mass or hydronephrosis visualized.

Bladder:

Appears normal for degree of bladder distention.

Other:

Cholelithiasis. Increased hepatic echogenicity as can be seen with
hepatic steatosis.
IMPRESSION: 1. Mild bilateral renal atrophy. Otherwise no focal renal
abnormality.
2. Cholelithiasis.

## 2023-04-22 ENCOUNTER — Ambulatory Visit: Payer: Medicare Other | Admitting: Cardiology

## 2023-04-22 ENCOUNTER — Encounter: Payer: Self-pay | Admitting: Cardiology

## 2023-04-22 VITALS — BP 118/68 | HR 74 | Resp 16 | Ht <= 58 in | Wt 120.8 lb

## 2023-04-22 DIAGNOSIS — E782 Mixed hyperlipidemia: Secondary | ICD-10-CM

## 2023-04-22 DIAGNOSIS — I10 Essential (primary) hypertension: Secondary | ICD-10-CM

## 2023-04-22 NOTE — Progress Notes (Signed)
Primary Physician/Referring:  Vladimir Crofts, FNP  Patient ID: Christine Black, female    DOB: 1946-01-28, 77 y.o.   MRN: [id number]  No chief complaint on file.  HPI:    Christine Black  is a 77 y.o.  [place] female patient with hypercholesterolemia, diabetes mellitus, no hypertension or family history of premature coronary disease or tobacco use disorder. She walks daily without chest pain, dyspnea, claudication.  She presents here for follow-up of hypertension and hypercholesterolemia.  She is presently doing well and essentially remains asymptomatic.  Accompanied by her daughter.    Past Medical History:  Diagnosis Date   Arthritis    Diabetes mellitus without complication (HCC)    Hyperlipidemia    Osteoporosis    Thyroid disease    Past Surgical History:  Procedure Laterality Date   FINGER AMPUTATION Left    Middle and ring finger tips   KNEE ARTHROSCOPY Bilateral    ROTATOR CUFF REPAIR Bilateral    SPINE SURGERY     Family History  Problem Relation Age of Onset   Arthritis Mother    Heart attack Father 69   Arthritis Brother    Heart disease Brother    Lung disease Brother    Breast cancer Neg Hx     Social History   Tobacco Use   Smoking status: Never    Passive exposure: Never   Smokeless tobacco: Never  Substance Use Topics   Alcohol use: Not Currently   Marital Status: Widowed  ROS  Review of Systems  Cardiovascular:  Negative for chest pain, dyspnea on exertion and leg swelling.   Objective  Blood pressure 118/68, pulse 74, resp. rate 16, height 4\' 7"  (1.397 m), weight 120 lb 12.8 oz (54.8 kg), SpO2 95 %. Body mass index is 28.08 kg/m.      04/22/2023    3:55 PM 12/17/2022    9:34 AM 11/23/2022    8:42 AM  Vitals with BMI  Height 4\' 7"  4\' 7"    Weight 120 lbs 13 oz 121 lbs   BMI 28.08 28.12   Systolic 118 125 474  Diastolic 68 76 71  Pulse 74 71 74     Physical Exam Neck:     Vascular: No carotid bruit or JVD.  Cardiovascular:     Rate and  Rhythm: Normal rate and regular rhythm.     Pulses: Intact distal pulses.     Heart sounds: Normal heart sounds. No murmur heard.    No gallop.  Pulmonary:     Effort: Pulmonary effort is normal.     Breath sounds: Normal breath sounds.  Abdominal:     General: Bowel sounds are normal.     Palpations: Abdomen is soft.  Musculoskeletal:     Right lower leg: No edema.     Left lower leg: No edema.     Laboratory examination:   Lipid Panel     Component Value Date/Time   CHOL 175 10/16/2022 1630   TRIG 333 (H) 10/16/2022 1630   HDL 55 10/16/2022 1630   LDLCALC 68 10/16/2022 1630   LDLDIRECT 161 (H) 06/05/2021 0810   LABVLDL 52 (H) 10/16/2022 1630     External labs    Cholesterol, total 175.000 m 10/16/2022 HDL 55.000 mg 10/16/2022 LDL 68.000 mg 10/16/2022 Triglycerides 333.000 m 10/16/2022  Labs 06/26/2022:  Total cholesterol 216, triglycerides 261, HDL 55, LDL 121.  Non-HDL cholesterol 161.  A1C 6.400 03/24/2022  Labs 08/20/2021:  Total cholesterol 152, triglycerides 110,  HDL 57, LDL 75.  Non-HDL cholesterol 95. Allergies   Allergies  Allergen Reactions   Nsaids Other (See Comments)    Renal dysfunction    Final Medications at End of Visit     Current Outpatient Medications:    acetaminophen (TYLENOL) 500 MG tablet, Take 500 mg by mouth every 6 (six) hours as needed., Disp: , Rfl:    Apple Cider Vinegar 188 MG CAPS, Take by mouth., Disp: , Rfl:    atorvastatin (LIPITOR) 20 MG tablet, Take 1 tablet (20 mg total) by mouth at bedtime., Disp: 90 tablet, Rfl: 3   cetirizine (ZYRTEC) 10 MG tablet, Take 10 mg by mouth daily., Disp: , Rfl:    Cholecalciferol (VITAMIN D3) 1.25 MG (50000 UT) CAPS, Take 1 capsule by mouth daily., Disp: , Rfl:    fenofibrate micronized (LOFIBRA) 134 MG capsule, Take 134 mg by mouth daily before breakfast., Disp: , Rfl:    fish oil-omega-3 fatty acids 1000 MG capsule, Take 2 g by mouth daily., Disp: , Rfl:    FREESTYLE LITE test strip, , Disp:  , Rfl:    gabapentin (NEURONTIN) 300 MG capsule, Take 300 mg by mouth 3 (three) times daily., Disp: , Rfl:    glipiZIDE (GLUCOTROL XL) 10 MG 24 hr tablet, Take 10 mg by mouth daily., Disp: , Rfl:    ibandronate (BONIVA) 150 MG tablet, Take 150 mg by mouth every 30 (thirty) days. Take in the morning with a full glass of water, on an empty stomach, and do not take anything else by mouth or lie down for the next 30 min., Disp: , Rfl:    JARDIANCE 10 MG TABS tablet, Take 10 mg by mouth daily., Disp: , Rfl:    levothyroxine (SYNTHROID) 75 MCG tablet, Take 75 mcg by mouth daily before breakfast., Disp: , Rfl:    losartan (COZAAR) 25 MG tablet, Take 25 mg by mouth daily., Disp: , Rfl:    montelukast (SINGULAIR) 10 MG tablet, Take 10 mg by mouth daily., Disp: , Rfl:    Multiple Vitamins-Minerals (CENTRUM SILVER ADULT 50+) TABS, Take 1 tablet by mouth daily., Disp: , Rfl:    Multiple Vitamins-Minerals (HAIR SKIN NAILS PO), Take 1 tablet by mouth daily., Disp: , Rfl:    pantoprazole (PROTONIX) 40 MG tablet, Take 40 mg by mouth daily., Disp: , Rfl:   Radiology:   No results found.  Cardiac Studies:   None  EKG:   EKG 04/22/2023: Normal sinus rhythm at rate of 68 bpm, leftward axis, incomplete right bundle branch block.  Borderline low voltage complexes.  Nonspecific T abnormality.  Compared to 08/20/2022, no significant change.   EKG 06/02/2021: Normal sinus rhythm at rate of 82 bpm, normal axis, incomplete right bundle branch block.  No evidence of ischemia.    Assessment     ICD-10-CM   1. Primary hypertension  I10 EKG 12-Lead    2. Mixed hyperlipidemia  E78.2        CV risk > 20%  Medications Discontinued During This Encounter  Medication Reason   amLODipine (NORVASC) 2.5 MG tablet    fenofibrate (TRICOR) 145 MG tablet Dose change     No orders of the defined types were placed in this encounter.  Orders Placed This Encounter  Procedures   EKG 12-Lead    Recommendations:   Christine Black is a 77 y.o. [place] female patient with hypercholesterolemia, diabetes mellitus, no hypertension or family history of premature coronary disease or tobacco use disorder.  She walks daily without chest pain, dyspnea, claudication.  She presents here for follow-up of hypertension and hypercholesterolemia.  1. Primary hypertension Patient is presently doing well, this is 32-month office visit, external labs reviewed, normal renal function, blood pressure under excellent control on losartan.  Continue the same.  She was previously started on amlodipine which she is not taking however losartan was added recently with excellent control in blood pressure.  - EKG 12-Lead 2. Mixed hyperlipidemia With regard to mixed hypercholesterolemia, lipids are in excellent control.  I reviewed her external labs.  No changes needed, in view of diabetes mellitus, goal LDL closer to 70 and she is presently at goal, continue high intensity high-dose atorvastatin 20 mg daily.  She is tolerating this without any side effects.  Overall stable from cardiac standpoint, I will see her back on a as needed basis.     Yates Decamp, MD, [facility] 04/22/2023, 6:18 PM Office: [phone] Fax: [phone] Pager: [phone]

## 2023-05-15 ENCOUNTER — Ambulatory Visit
Admission: EM | Admit: 2023-05-15 | Discharge: 2023-05-15 | Disposition: A | Discharge status: Home / Self Care | Payer: Medicare Other | Attending: Nurse Practitioner | Admitting: Nurse Practitioner

## 2023-05-15 DIAGNOSIS — M62838 Other muscle spasm: Secondary | ICD-10-CM

## 2023-05-15 MED ORDER — MELOXICAM 7.5 MG PO TABS
7.5000 mg | ORAL_TABLET | Freq: Every day | ORAL | 0 refills | Status: AC
Start: 2023-05-15 — End: 2023-05-22

## 2023-05-15 MED ORDER — METHOCARBAMOL 500 MG PO TABS
500.0000 mg | ORAL_TABLET | Freq: Two times a day (BID) | ORAL | 0 refills | Status: AC | PRN
Start: 2023-05-15 — End: ?

## 2023-05-15 NOTE — Discharge Instructions (Signed)
Start meloxicam once a day for 7 days.  You may continue to take Tylenol if you need to Robaxin twice daily as needed.  Please note this medication can make you drowsy.  Do not drink alcohol or drive while you take this medication Heat to your neck as needed Recommend if you can get a massage that will also help Follow-up with your PCP if your symptoms do not improve Please go to the emergency room if you develop any worsening symptoms

## 2023-05-15 NOTE — ED Triage Notes (Signed)
Pt presents to UC w/ c/o right sided neck pain x5 days. Denies direct injury. Pt took gabapentin today for pain.

## 2023-05-15 NOTE — ED Provider Notes (Signed)
[facility]    CSN: [id number] Arrival date & time: 05/15/23  4098      History   Chief Complaint Chief Complaint  Patient presents with   Neck Pain    HPI Christine Black is a 77 y.o. female presents for evaluation of neck pain.  Patient is accompanied by family member.  Chart states patient speaks [place] but she does speak English very well and no interpretation was needed.  Patient reports 5 days of a persistent right-sided neck pain that radiates into her right trapezius.  Denies any known injury or inciting event.  States she just woke up with it. No numbness/tingling/weakness of her upper extremities. No hx of neck pain/fractures/surgeries in the past. Has been taking Tylenol and gabapentin and using over-the-counter Salonpas patches with minimal improvement.  No other concerns at this time.   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      Home Medications    Prior to Admission medications   Medication Sig Start Date End Date Taking? Authorizing Provider  meloxicam (MOBIC) 7.5 MG tablet Take 1 tablet (7.5 mg total) by mouth daily for 7 days. 05/15/23 05/22/23 Yes Radford Pax, NP  methocarbamol (ROBAXIN) 500 MG tablet Take 1 tablet (500 mg  total) by mouth 2 (two) times daily as needed for muscle spasms. 05/15/23  Yes Radford Pax, NP  acetaminophen (TYLENOL) 500 MG tablet Take 500 mg by mouth every 6 (six) hours as needed.    [provider]  Apple Cider Vinegar 188 MG CAPS Take by mouth.    [provider]  atorvastatin (LIPITOR) 20 MG tablet Take 1 tablet (20 mg total) by mouth at bedtime. 10/22/22 10/17/23  Nori Riis, NP  cetirizine (ZYRTEC) 10 MG tablet Take 10 mg by mouth daily.    [provider]  Cholecalciferol (VITAMIN D3) 1.25 MG (50000 UT) CAPS Take 1 capsule by mouth daily.    [provider]  fenofibrate micronized (LOFIBRA) 134 MG capsule Take 134 mg by mouth daily before breakfast. 04/08/23   [provider]  fish oil-omega-3 fatty acids 1000 MG capsule Take 2 g by mouth daily.    [provider]  FREESTYLE LITE test strip  10/30/22   [provider]  gabapentin (NEURONTIN) 300 MG capsule Take 300 mg by mouth 3 (three) times daily. 02/12/23   [provider]  glipiZIDE (GLUCOTROL XL) 10 MG 24 hr tablet Take 10 mg by mouth daily. 05/13/21   [provider]  ibandronate (BONIVA) 150 MG tablet Take 150 mg by mouth every 30 (thirty) days. Take in the morning with a full glass of water, on an  empty stomach, and do not take anything else by mouth or lie down for the next 30 min.    [provider]  JARDIANCE 10 MG TABS tablet Take 10 mg by mouth daily. 06/23/22   [provider]  levothyroxine (SYNTHROID) 75 MCG tablet Take 75 mcg by mouth daily before breakfast. 08/01/19   [provider]  losartan (COZAAR) 25 MG tablet Take 25 mg by mouth daily. 02/03/23   [provider]  montelukast (SINGULAIR) 10 MG tablet Take 10 mg by mouth daily. 06/23/22   [provider]  Multiple Vitamins-Minerals (CENTRUM SILVER ADULT 50+) TABS Take 1 tablet by mouth daily.    [provider]  Multiple  Vitamins-Minerals (HAIR SKIN NAILS PO) Take 1 tablet by mouth daily.    [provider]  pantoprazole (PROTONIX) 40 MG tablet Take 40 mg by mouth daily.    [provider]    Family History Family History  Problem Relation Age of Onset   Arthritis Mother    Heart attack Father 56   Arthritis Brother    Heart disease Brother    Lung disease Brother    Breast cancer Neg Hx     Social History Social History   Tobacco Use   Smoking status: Never    Passive exposure: Never   Smokeless tobacco: Never  Vaping Use   Vaping Use: Never used  Substance Use Topics   Alcohol use: Not Currently   Drug use: Never     Allergies   Nsaids   Review of Systems Review of Systems  Musculoskeletal:  Positive for neck pain.     Physical Exam Triage Vital Signs ED Triage Vitals  Enc Vitals Group     BP 05/15/23 0905 122/72     Pulse Rate 05/15/23 0905 67     Resp 05/15/23 0905 16     Temp 05/15/23 0905 98.6 F (37 C)     Temp Source 05/15/23 0905 Oral     SpO2 05/15/23 0905 97 %     Weight --      Height --      Head Circumference --      Peak Flow --      Pain Score 05/15/23 0910 7     Pain Loc --      Pain Edu? --      Excl. in GC? --    No data found.  Updated Vital Signs BP 122/72 (BP Location: Right Arm)   Pulse 67   Temp 98.6 F (37 C) (Oral)   Resp 16   SpO2 97%   Visual Acuity Right Eye Distance:   Left Eye Distance:   Bilateral Distance:    Right Eye Near:   Left Eye Near:    Bilateral Near:     Physical Exam Vitals and nursing note reviewed.  Constitutional:      General: She is not in acute distress.    Appearance: Normal appearance. She is not ill-appearing, toxic-appearing or diaphoretic.  HENT:     Head: Normocephalic and atraumatic.  Eyes:     Pupils: Pupils are equal, round, and reactive to light.  Neck:      Comments: Tender to palpation to right paracervical spinal muscles that extends to right trapezius.  Full range  of motion neck with pain on active flexion and right-sided rotation.  Strength is 5 out of 5 bilateral upper extremities Cardiovascular:     Rate and Rhythm: Normal rate.  Pulmonary:  Effort: Pulmonary effort is normal.  Musculoskeletal:     Cervical back: Normal range of motion and neck supple. No edema, erythema, signs of trauma, rigidity or torticollis. Pain with movement present. No spinous process tenderness or muscular tenderness. Normal range of motion.  Skin:    General: Skin is warm and dry.  Neurological:     General: No focal deficit present.     Mental Status: She is alert and oriented to person, place, and time.  Psychiatric:        Mood and Affect: Mood normal.        Behavior: Behavior normal.      UC Treatments / Results  Labs (all labs ordered are listed, but only abnormal results are displayed) Labs Reviewed - No data to display  COMPLETE METABOLIC PANEL WITH GFR Order: [id number] Status: Final result     Visible to patient: Yes (not seen)     Next appt: None     Dx: Other fatigue   4 Result Notes     1 HM Topic    Component Ref Range & Units 5 mo ago  Glucose, Bld 65 - 99 mg/dL 102 High   Comment: .            Fasting reference interval . For someone without known diabetes, a glucose value >125 mg/dL indicates that they may have diabetes and this should be confirmed with a follow-up test. .  BUN 7 - 25 mg/dL 20  Creat 7.25 - 3.66 mg/dL 4.40 High   eGFR > OR = 60 mL/min/1.54m2 48 Low   BUN/Creatinine Ratio 6 - 22 (calc) 17  Sodium 135 - 146 mmol/L 137  Potassium 3.5 - 5.3 mmol/L 3.8  Chloride 98 - 110 mmol/L 101  CO2 20 - 32 mmol/L 28  Calcium 8.6 - 10.4 mg/dL 34.7  Total Protein 6.1 - 8.1 g/dL 7.9  Albumin 3.6 - 5.1 g/dL 5.1  Globulin 1.9 - 3.7 g/dL (calc) 2.8  AG Ratio 1.0 - 2.5 (calc) 1.8  Total Bilirubin 0.2 - 1.2 mg/dL 0.4  Alkaline phosphatase (APISO) 37 - 153 U/L 51  AST 10 - 35 U/L 26  ALT 6 - 29 U/L 19   Resulting Agency [facility]         Resulting Agency's Comment  Performing Organization Information:     Site [facility] (CLIA: [id number])     Name: Tawni Millers     Address: [address] [place], [place] [address]     Director: Marica Otter MD        EKG   Radiology No results found.  Procedures Procedures (including critical care time)  Medications Ordered in UC Medications - No data to display  Initial Impression / Assessment and Plan / UC Course  I have reviewed the triage vital signs and the nursing notes.  Pertinent labs & imaging results that were available during my care of the patient were reviewed by me and considered in my medical decision making (see chart for details).     Reviewed exam and sx with patient. No red flags Discussed muscle tension/spasm as cause of symptoms Reviewed labs, GFR 48.  No adjustment indicated for meloxicam.  Will do trial of this daily for 7 days.  She can continue Tylenol as needed Low-dose Robaxin twice daily as needed.  Side effect profile reviewed Recommend heat and massage PCP follow-up if symptoms do not improve ER precautions reviewed and patient verbalized understanding  Final Clinical Impressions(s) /  UC Diagnoses   Final diagnoses:  Neck muscle spasm     Discharge Instructions      Start meloxicam once a day for 7 days.  You may continue to take Tylenol if you need to Robaxin twice daily as needed.  Please note this medication can make you drowsy.  Do not drink alcohol or drive while you take this medication Heat to your neck as needed Recommend if you can get a massage that will also help Follow-up with your PCP if your symptoms do not improve Please go to the emergency room if you develop any worsening symptoms    ED Prescriptions     Medication Sig Dispense Auth. Provider   meloxicam (MOBIC) 7.5 MG tablet Take 1 tablet (7.5 mg total) by mouth daily for 7 days.  7 tablet Radford Pax, NP   methocarbamol (ROBAXIN) 500 MG tablet Take 1 tablet (500 mg total) by mouth 2 (two) times daily as needed for muscle spasms. 10 tablet Radford Pax, NP      PDMP not reviewed this encounter.   Radford Pax, NP 05/15/23 [phone]
